# Patient Record
Sex: Male | Born: 1985 | Race: White | Hispanic: No | Marital: Single | Smoking: Current every day smoker
Health system: Southern US, Community
[De-identification: ages and names within clinical notes are randomized; demographics above are authoritative.]

## PROBLEM LIST (undated history)

## (undated) DIAGNOSIS — T7840XA Allergy, unspecified, initial encounter: Secondary | ICD-10-CM

## (undated) DIAGNOSIS — M549 Dorsalgia, unspecified: Secondary | ICD-10-CM

## (undated) HISTORY — DX: Dorsalgia, unspecified: M54.9

## (undated) HISTORY — PX: HERNIA REPAIR: SHX51

## (undated) HISTORY — DX: Allergy, unspecified, initial encounter: T78.40XA

## (undated) HISTORY — PX: FINGER SURGERY: SHX640

---
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
## 2007-01-04 ENCOUNTER — Emergency Department (HOSPITAL_COMMUNITY): Admission: EM | Admit: 2007-01-04 | Discharge: 2007-01-04 | Payer: Self-pay | Admitting: Emergency Medicine

## 2007-02-11 ENCOUNTER — Emergency Department (HOSPITAL_COMMUNITY): Admission: EM | Admit: 2007-02-11 | Discharge: 2007-02-11 | Payer: Self-pay | Admitting: Emergency Medicine

## 2007-02-17 ENCOUNTER — Emergency Department (HOSPITAL_COMMUNITY): Admission: EM | Admit: 2007-02-17 | Discharge: 2007-02-17 | Payer: Self-pay | Admitting: Emergency Medicine

## 2007-06-21 ENCOUNTER — Emergency Department (HOSPITAL_COMMUNITY): Admission: EM | Admit: 2007-06-21 | Discharge: 2007-06-21 | Payer: Self-pay | Admitting: Emergency Medicine

## 2007-06-24 ENCOUNTER — Emergency Department (HOSPITAL_COMMUNITY): Admission: EM | Admit: 2007-06-24 | Discharge: 2007-06-24 | Payer: Self-pay | Admitting: Emergency Medicine

## 2007-08-31 ENCOUNTER — Emergency Department (HOSPITAL_COMMUNITY): Admission: EM | Admit: 2007-08-31 | Discharge: 2007-08-31 | Payer: Self-pay | Admitting: Emergency Medicine

## 2007-09-01 ENCOUNTER — Observation Stay (HOSPITAL_COMMUNITY): Admission: EM | Admit: 2007-09-01 | Discharge: 2007-09-02 | Payer: Self-pay | Admitting: Emergency Medicine

## 2007-10-04 ENCOUNTER — Emergency Department (HOSPITAL_COMMUNITY): Admission: EM | Admit: 2007-10-04 | Discharge: 2007-10-04 | Payer: Self-pay | Admitting: Emergency Medicine

## 2007-11-01 ENCOUNTER — Emergency Department (HOSPITAL_COMMUNITY): Admission: EM | Admit: 2007-11-01 | Discharge: 2007-11-01 | Payer: Self-pay | Admitting: Emergency Medicine

## 2007-11-30 ENCOUNTER — Emergency Department (HOSPITAL_COMMUNITY): Admission: EM | Admit: 2007-11-30 | Discharge: 2007-11-30 | Payer: Self-pay | Admitting: Emergency Medicine

## 2008-01-12 ENCOUNTER — Emergency Department (HOSPITAL_COMMUNITY): Admission: EM | Admit: 2008-01-12 | Discharge: 2008-01-12 | Payer: Self-pay | Admitting: Emergency Medicine

## 2008-04-12 ENCOUNTER — Emergency Department (HOSPITAL_COMMUNITY): Admission: EM | Admit: 2008-04-12 | Discharge: 2008-04-12 | Payer: Self-pay | Admitting: Emergency Medicine

## 2008-06-05 ENCOUNTER — Emergency Department (HOSPITAL_COMMUNITY): Admission: EM | Admit: 2008-06-05 | Discharge: 2008-06-05 | Payer: Self-pay | Admitting: Family Medicine

## 2008-06-09 ENCOUNTER — Ambulatory Visit (HOSPITAL_COMMUNITY): Admission: RE | Admit: 2008-06-09 | Discharge: 2008-06-09 | Payer: Self-pay | Admitting: Family Medicine

## 2008-07-04 ENCOUNTER — Encounter (HOSPITAL_COMMUNITY): Admission: RE | Admit: 2008-07-04 | Discharge: 2008-08-03 | Payer: Self-pay | Admitting: Family Medicine

## 2009-07-12 ENCOUNTER — Emergency Department (HOSPITAL_COMMUNITY): Admission: EM | Admit: 2009-07-12 | Discharge: 2009-07-12 | Payer: Self-pay | Admitting: Emergency Medicine

## 2009-08-06 ENCOUNTER — Emergency Department (HOSPITAL_COMMUNITY): Admission: EM | Admit: 2009-08-06 | Discharge: 2009-08-06 | Payer: Self-pay | Admitting: Emergency Medicine

## 2009-08-30 ENCOUNTER — Emergency Department (HOSPITAL_COMMUNITY): Admission: EM | Admit: 2009-08-30 | Discharge: 2009-08-30 | Payer: Self-pay | Admitting: Emergency Medicine

## 2009-11-18 ENCOUNTER — Emergency Department (HOSPITAL_COMMUNITY): Admission: EM | Admit: 2009-11-18 | Discharge: 2009-11-18 | Payer: Self-pay | Admitting: Emergency Medicine

## 2009-12-18 ENCOUNTER — Emergency Department (HOSPITAL_COMMUNITY): Admission: EM | Admit: 2009-12-18 | Discharge: 2009-12-18 | Payer: Self-pay | Admitting: Emergency Medicine

## 2010-05-07 LAB — DIFFERENTIAL
Lymphocytes Relative: 45 % (ref 12–46)
Monocytes Absolute: 0.4 10*3/uL (ref 0.1–1.0)
Monocytes Relative: 6 % (ref 3–12)
Neutro Abs: 3.1 10*3/uL (ref 1.7–7.7)
Neutrophils Relative %: 44 % (ref 43–77)

## 2010-05-07 LAB — COMPREHENSIVE METABOLIC PANEL
Albumin: 4 g/dL (ref 3.5–5.2)
BUN: 9 mg/dL (ref 6–23)
Calcium: 9.1 mg/dL (ref 8.4–10.5)
Creatinine, Ser: 0.81 mg/dL (ref 0.4–1.5)
Glucose, Bld: 97 mg/dL (ref 70–99)
Total Protein: 6.5 g/dL (ref 6.0–8.3)

## 2010-05-07 LAB — AMYLASE: Amylase: 67 U/L (ref 27–131)

## 2010-05-07 LAB — URINALYSIS, ROUTINE W REFLEX MICROSCOPIC
Glucose, UA: NEGATIVE mg/dL
Ketones, ur: NEGATIVE mg/dL
Leukocytes, UA: NEGATIVE
Protein, ur: NEGATIVE mg/dL

## 2010-05-07 LAB — CBC
HCT: 39 % (ref 39.0–52.0)
MCHC: 34.9 g/dL (ref 30.0–36.0)
MCV: 96.3 fL (ref 78.0–100.0)
Platelets: 180 10*3/uL (ref 150–400)
RDW: 13.5 % (ref 11.5–15.5)

## 2010-06-06 ENCOUNTER — Emergency Department (HOSPITAL_COMMUNITY)
Admission: EM | Admit: 2010-06-06 | Discharge: 2010-06-07 | Disposition: A | Discharge status: Home / Self Care | Payer: Self-pay | Attending: Emergency Medicine | Admitting: Emergency Medicine

## 2010-06-06 DIAGNOSIS — M25579 Pain in unspecified ankle and joints of unspecified foot: Secondary | ICD-10-CM | POA: Insufficient documentation

## 2010-06-06 DIAGNOSIS — Y92410 Unspecified street and highway as the place of occurrence of the external cause: Secondary | ICD-10-CM | POA: Insufficient documentation

## 2010-06-06 DIAGNOSIS — Y998 Other external cause status: Secondary | ICD-10-CM | POA: Insufficient documentation

## 2010-06-06 DIAGNOSIS — IMO0002 Reserved for concepts with insufficient information to code with codable children: Secondary | ICD-10-CM | POA: Insufficient documentation

## 2010-06-07 ENCOUNTER — Emergency Department (HOSPITAL_COMMUNITY): Payer: Self-pay

## 2010-06-11 NOTE — H&P (Signed)
NAME:  Dustin Castillo, Dustin Castillo               ACCOUNT NO.:  [id number]   MEDICAL RECORD NO.:  [id number]          PATIENT TYPE:  INP   LOCATION:  5123                         FACILITY:  [facility]   PHYSICIAN:  Antony Contras, MD     DATE OF BIRTH:  03-09-1985   DATE OF ADMISSION:  09/01/2007  DATE OF DISCHARGE:                              HISTORY & PHYSICAL   CHIEF COMPLAINT:  Throat bleeding.   HISTORY OF PRESENT ILLNESS:  The patient is a 25 year old white male who  underwent tonsillectomy 1 week ago because of breathing troubles.  He  was having significant throat pain earlier today and started bleeding  from his throat at about 10:30 in the morning.  He went to [facility]  Emergency Room and was checked and able to be sent home with no  additional bleeding.  He came by our office afterwards and got a pain  medication prescription.  He was not bleeding at that time.  Earlier  this evening, he became upset, but was able to go to sleep, but woke up  later with bleeding from his throat again.  He presented back to [facility] Emergency Room and was transferred to [facility].  He  continues to bleed from his throat presently.   PAST MEDICAL HISTORY:  Substance abuse.   PAST SURGICAL HISTORY:  Tonsillectomy.   MEDICATIONS:  Percocet.   ALLERGIES:  No known drug allergies.   FAMILY HISTORY:  No bleeding problems.   SOCIAL HISTORY:  The patient is a smoker.   REVIEW OF SYSTEMS:  Negative except as listed above.   PHYSICAL EXAMINATION:  VITAL SIGNS: Temperature 97.8, blood pressure  103/61, pulse 72, and respirations 14.  GENERAL:  The patient is bleeding from his mouth and spitting out blood  in clots.  He is alert and interactive.  EYES:  Extraocular movements are intact.  Pupils are equal, round, and  reactive to light.  EARS:  External ears are normal and ear canals are patent.  NOSE:  Nose is normal except for some blood clot in the right nasal  passage.  ORAL CAVITY,  OROPHARYNX:  There is blood in the mouth with clot and  bleeding from the left tonsil fossa.  The teeth, gums,  tongue, and  floor of mouth are normal.  NECK:  Nontender without mass or deformity.  THYROID:  Normal to palpation.  SALIVARY GLANDS:  Normal to palpation.  CRANIAL NERVES: II-XII are grossly intact.   LABORATORY DATA:  Hemoglobin 12.9.   ASSESSMENT:  The patient is a 25 year old white male with post  tonsillectomy hemorrhage.   PLAN:  The patient will be brought immediately to the operating room for  control of bleeding under anesthesia.  This was discussed with the  patient and his grandfather and risks, benefits, and alternatives were  discussed and the stomach will be suctioned out in the operating room.  After surgery, he will be admitted overnight for hydration and pain  control.      Antony Contras, MD  Electronically Signed     DDB/MEDQ  D:  09/01/2007  T:  09/01/2007  Job:  [id number]

## 2010-06-11 NOTE — Op Note (Signed)
NAME:  Dustin Castillo, Dustin Castillo               ACCOUNT NO.:  [id number]   MEDICAL RECORD NO.:  [id number]          PATIENT TYPE:  INP   LOCATION:  5123                         FACILITY:  [facility]   PHYSICIAN:  Antony Contras, MD     DATE OF BIRTH:  [birth year]   DATE OF PROCEDURE:  09/01/2007  DATE OF DISCHARGE:                               OPERATIVE REPORT   PREOPERATIVE DIAGNOSIS:  Post tonsillectomy hemorrhage.   POSTOPERATIVE DIAGNOSIS:  Post tonsillectomy hemorrhage.   PROCEDURE:  Electrocautery control of post tonsillectomy hemorrhage.   SURGEON:  Antony Contras, MD   ANESTHESIA:  General endotracheal anesthesia.   COMPLICATIONS:  None.   INDICATIONS:  The patient is a 25 year old white male who underwent  tonsillectomy 1 week ago and started bleeding earlier today.  He went to  the emergency department where it stopped, but resumed again this  evening.  He returned to the emergency department and was transferred to  the [facility]  He is actively bleeding and is brought to the  operating room for control of bleeding.   FINDINGS:  There was a clot covering the right tonsil fossa that was  removed exposing an inferior pole bleeding site.  This was controlled  with electrocautery.  The stomach was suctioned and found to have old  blood and clot within it.   DESCRIPTION OF PROCEDURE:  The patient was identified in the holding  room, and informed consent having been obtained including discussion of  risks, benefits, and alternatives, the patient was brought to the  operative suite and put on the operative table in supine position.  Anesthesia was induced and the patient was intubated by anesthesia team  without difficulty.  The eyes were taped closed and the bed was turned  90 degrees from anesthesia.  Head wrap was placed around the patient's  head.  A Crowe-Davis retractor was inserted in the mouth and the opening  revealed the oropharynx.  He was placed in a suspension on the  Mayo  stand.  The oropharynx was suctioned and the clot removed from the right  tonsillar fossa.  Bleeding site was exposed and was then controlled with  suction cautery at a setting of 45.  After this was completed, the  throat was copiously irrigated with saline.  A large nasogastric tube  was passed down the esophagus to suck out the stomach and esophagus.  A  little bit of saline  lavage was included in this.  After this was completed, the retractors  were taken out of suspension and removed from the patient's mouth.  He  was then turned back in to Anesthesia for wake up and was extubated and  moved to the recovery room in stable condition.      Antony Contras, MD  Electronically Signed     DDB/MEDQ  D:  09/01/2007  T:  09/01/2007  Job:  [id number]

## 2010-06-14 NOTE — Op Note (Signed)
NAME:  Dustin, Castillo                       ACCOUNT NO.:  [id number]   MEDICAL RECORD NO.:  [id number]                   PATIENT TYPE:  EMS   LOCATION:  ED                                   FACILITY:  [facility]   PHYSICIAN:  Dionne Ano. Everlene Other, M.D.         DATE OF BIRTH:  06/27/1985   DATE OF PROCEDURE:  DATE OF DISCHARGE:                                 OPERATIVE REPORT   PREOPERATIVE DIAGNOSIS:  Dustin Castillo is a very pleasant male, referred  by Lorin Picket A. Luking, M.D. in regard to his upper extremity injury.  The  patient is a 25 year old, right-hand-dominant male, who injured his right  middle finger earlier today while playing basketball.  He had an open distal  phalanx fracture through the growth plate with nail bed laceration.  He  presented to Dr. Fletcher Anon office.  X-rays revealed the open fracture, and he  was transferred for further definitive care.   PAST MEDICAL HISTORY:  None.   PAST SURGICAL HISTORY:  Umbilical hernia repair.   MEDICINES:  None.   ALLERGIES:  None.   SOCIAL HISTORY:  He is a healthy 25 year old male.   PHYSICAL EXAMINATION:  RIGHT MIDDLE FINGER:  The patient has a right middle  finger open distal phalanx fracture through the growth plate with nail bed  avulsion and a hematoma.  The nail bed is partially attached.  The remaining  fingers are intact.  WRIST/ELBOW/SHOULDER:  Benign.  BILATERAL LOWER EXTREMITIES/OPPOSITE EXTREMITY:  Benign, neurovascularly  intact, normal alignment, __________ range of motion.  VITAL SIGNS:  He is afebrile.  Vital signs are stable.  GENERAL:  The patient is awake, alert, and oriented and accompanied to the  emergency room with his grandmother.   RADIOLOGY:  X-rays are reviewed which show a fracture to the growth plate.  This is displaced and angulated.   IMPRESSION:  Open distal phalanx fracture, right middle finger.   PLAN:  I have discussed with the patient his findings.  I have verbally  consented him  for I&D and repair of structures as necessary.   PROCEDURE NOTE:  The patient was given a lidocaine/Marcaine block in the  metacarpal and tendon sheath in nature.  He tolerated this well.  He was  then prepped and draped in the usual sterile fashion with Betadine scrub and  paint.  Following this, he was laid supine, had a sterile field secured, and  underwent I&D of skin and subcutaneous tissue, bone and nail bed tissue, and  tendinous tissue.  I performed a nail plate removal without difficulty.  He  tolerated this well.  Once the plate removal was accomplished, we irrigated  the area with greater than two liters of saline, curettaged to bony ends,  being careful to not disrupt the growth plate.  I did this very gently and  lightly so as to not encroach upon the patient's growth plate.  Once this  was done, the patient  underwent open reduction.  This was done to my  satisfaction without difficulty.  Following this, the patient had repair of  the nail bed.  This was done with 4-0 and 6-0 chromic suture.  This was done  under 4.0 loupe magnification.  I did make step cuts in the eponychial fold  during this and did have to disimpact the eponychium from the fracture site,  as it was imbedded during the initial I&D.  Once nail bed repair was  accomplished, the lateral folds were sutured with 4-0 chromic.  The patient  was then held in a reduced position and had sterile dressing application  after tourniquet was deflated and hemostasis was obtained.  Adaptic was  placed in the eponychial fold to prevent nail bed adherence, and the patient  tolerated this well.  Following this, finger splint was placed to keep the  DIP in a hyperextended position.  He had excellent refill and was placed in  a volar short arm splint.  He had postreduction x-rays taken which I  reviewed.  He tolerated the procedure well, and there were no complications.  He will be discharged home on Keflex, Vicodin, and Robaxin  and will return  to see [place] in seven days for x-ray, and we will keep him immobilized for  three weeks in the splint.  All questions have been encouraged and answered.  He tolerated the procedure well.                                               Dionne Ano. Everlene Other, M.D.    Nash Mantis  D:  05/05/2002  T:  05/06/2002  Job:  [id number]   cc:   Lorin Picket A. Gerda Diss, M.D.  [address], [address]  [place]  [place] [address]  Fax: [phone]

## 2010-10-23 LAB — DIFFERENTIAL
Basophils Absolute: 0
Basophils Relative: 0
Eosinophils Relative: 2
Lymphocytes Relative: 33
Monocytes Absolute: 0.8

## 2010-10-23 LAB — RAPID STREP SCREEN (MED CTR MEBANE ONLY): Streptococcus, Group A Screen (Direct): NEGATIVE

## 2010-10-23 LAB — BASIC METABOLIC PANEL
BUN: 8
CO2: 28
Calcium: 9
GFR calc non Af Amer: >60
Glucose, Bld: 80
Potassium: 3.3 — ABNORMAL LOW

## 2010-10-23 LAB — STREP A DNA PROBE: Group A Strep Probe: NEGATIVE

## 2010-10-23 LAB — CBC
HCT: 39
Hemoglobin: 14.2
MCHC: 36.5 — ABNORMAL HIGH
Platelets: 199
RDW: 12.9

## 2010-10-23 LAB — RAPID URINE DRUG SCREEN, HOSP PERFORMED
Amphetamines: NOT DETECTED
Barbiturates: POSITIVE — AB
Cocaine: NOT DETECTED
Opiates: NOT DETECTED
Tetrahydrocannabinol: NOT DETECTED

## 2010-10-25 LAB — CBC
HCT: 36.9 — ABNORMAL LOW
HCT: 41.8
Hemoglobin: 12.9 — ABNORMAL LOW
Hemoglobin: 14.1
MCHC: 33.6
MCHC: 34.9
MCV: 97.3
MCV: 99
Platelets: 262
RBC: 3.8 — ABNORMAL LOW
RBC: 4.23
RDW: 11.9
RDW: 12.2
WBC: 8.1

## 2010-10-25 LAB — DIFFERENTIAL
Basophils Absolute: 0
Basophils Absolute: 0.1
Basophils Relative: 0
Basophils Relative: 1
Eosinophils Absolute: 0.2
Eosinophils Absolute: 0.2
Eosinophils Relative: 2
Eosinophils Relative: 3
Lymphocytes Relative: 50 — ABNORMAL HIGH
Lymphs Abs: 4.1 — ABNORMAL HIGH
Monocytes Absolute: 0.6
Monocytes Absolute: 0.7
Monocytes Relative: 8
Monocytes Relative: 9
Neutro Abs: 3.2
Neutrophils Relative %: 39 — ABNORMAL LOW

## 2010-10-30 LAB — STREP A DNA PROBE

## 2010-10-30 LAB — RAPID STREP SCREEN (MED CTR MEBANE ONLY): Streptococcus, Group A Screen (Direct): NEGATIVE

## 2011-03-18 ENCOUNTER — Emergency Department (HOSPITAL_COMMUNITY): Payer: Self-pay

## 2011-03-18 ENCOUNTER — Emergency Department (HOSPITAL_COMMUNITY)
Admission: EM | Admit: 2011-03-18 | Discharge: 2011-03-18 | Disposition: A | Discharge status: Home / Self Care | Payer: Self-pay | Attending: Emergency Medicine | Admitting: Emergency Medicine

## 2011-03-18 ENCOUNTER — Encounter (HOSPITAL_COMMUNITY): Payer: Self-pay

## 2011-03-18 DIAGNOSIS — R109 Unspecified abdominal pain: Secondary | ICD-10-CM | POA: Insufficient documentation

## 2011-03-18 DIAGNOSIS — K625 Hemorrhage of anus and rectum: Secondary | ICD-10-CM | POA: Insufficient documentation

## 2011-03-18 DIAGNOSIS — F17200 Nicotine dependence, unspecified, uncomplicated: Secondary | ICD-10-CM | POA: Insufficient documentation

## 2011-03-18 LAB — DIFFERENTIAL
Basophils Relative: 0 % (ref 0–1)
Eosinophils Absolute: 0.3 10*3/uL (ref 0.0–0.7)
Eosinophils Relative: 4 % (ref 0–5)
Lymphs Abs: 2.8 10*3/uL (ref 0.7–4.0)
Monocytes Relative: 8 % (ref 3–12)

## 2011-03-18 LAB — COMPREHENSIVE METABOLIC PANEL
BUN: 10 mg/dL (ref 6–23)
Calcium: 9.7 mg/dL (ref 8.4–10.5)
GFR calc Af Amer: >90 mL/min (ref >90)
Glucose, Bld: 94 mg/dL (ref 70–99)
Total Protein: 7.4 g/dL (ref 6.0–8.3)

## 2011-03-18 LAB — CBC
Hemoglobin: 15.6 g/dL (ref 13.0–17.0)
MCH: 33.7 pg (ref 26.0–34.0)
MCHC: 34.8 g/dL (ref 30.0–36.0)
MCV: 96.8 fL (ref 78.0–100.0)
Platelets: 265 10*3/uL (ref 150–400)
RBC: 4.63 MIL/uL (ref 4.22–5.81)

## 2011-03-18 LAB — URINALYSIS, ROUTINE W REFLEX MICROSCOPIC
Leukocytes, UA: NEGATIVE
Nitrite: NEGATIVE
Specific Gravity, Urine: 1.005 (ref 1.005–1.030)
pH: 5.5 (ref 5.0–8.0)

## 2011-03-18 MED ORDER — ONDANSETRON HCL 4 MG/2ML IJ SOLN
4.0000 mg | Freq: Once | INTRAMUSCULAR | Status: AC
  Administered 2011-03-18: 4 mg via INTRAVENOUS
  Filled 2011-03-18: qty 2

## 2011-03-18 MED ORDER — IOHEXOL 300 MG/ML  SOLN
100.0000 mL | Freq: Once | INTRAMUSCULAR | Status: AC | PRN
  Administered 2011-03-18: 100 mL via INTRAVENOUS

## 2011-03-18 MED ORDER — HYDROCODONE-ACETAMINOPHEN 5-325 MG PO TABS: ORAL_TABLET | ORAL | Status: AC

## 2011-03-18 MED ORDER — MORPHINE SULFATE 4 MG/ML IJ SOLN
4.0000 mg | Freq: Once | INTRAMUSCULAR | Status: AC
  Administered 2011-03-18: 4 mg via INTRAVENOUS
  Filled 2011-03-18: qty 1

## 2011-03-18 MED ORDER — SODIUM CHLORIDE 0.9 % IV BOLUS (SEPSIS)
500.0000 mL | Freq: Once | INTRAVENOUS | Status: AC
  Administered 2011-03-18: 1000 mL via INTRAVENOUS

## 2011-03-18 MED ORDER — METOCLOPRAMIDE HCL 5 MG/ML IJ SOLN
10.0000 mg | Freq: Once | INTRAMUSCULAR | Status: AC
  Administered 2011-03-18: 10 mg via INTRAVENOUS
  Filled 2011-03-18: qty 2

## 2011-03-18 NOTE — ED Notes (Signed)
Pt reports having left abd pain/flank pain that began started. Pt also states, " my stomach feels uneasy. I went to have BM today and i noticed there was blood in toilet. The blood was bright red blood. There was a lot." pt ambulated to room without difficulty.

## 2011-03-18 NOTE — ED Notes (Signed)
Occult blood pos. Performed by t. tripplett

## 2011-03-18 NOTE — ED Notes (Signed)
Pt resting in bed, resp even 

## 2011-03-18 NOTE — ED Provider Notes (Signed)
History     CSN: [id number]  Arrival date & time 03/18/11  4098   First MD Initiated Contact with Patient 03/18/11 1001      Chief Complaint  Patient presents with  . Flank Pain  . Rectal Bleeding  . Nausea    (Consider location/radiation/quality/duration/timing/severity/associated sxs/prior treatment) HPI Comments: Patient complains of intermittent left flank pain for several days.  He states the pain has been mild.  This morning he states he woke up feeling nauseated the pain in his left flank became worse and slightly radiates to his abdomen.  Patient states that when he had a bowel movement this morning he noticed a large amount of bright red blood in the toilet and on the tissue after wiping.  He also reports loose stool this morning.  States that his stools occasionally "look like there is mucus in it".  He denies fever, dysuria, hematuria, or vomiting. He states he contacted his primary care physician this morning and was advised to come to the ER for further evaluation.  Patient is a 26 y.o. male presenting with flank pain and hematochezia. The history is provided by the patient. No language interpreter was used.  Flank Pain This is a new problem. The current episode started in the past 7 days. The problem occurs intermittently. The problem has been gradually worsening. Associated symptoms include abdominal pain and nausea. Pertinent negatives include no change in bowel habit, chest pain, chills, coughing, fever, headaches, neck pain, numbness, rash, sore throat, urinary symptoms, vomiting or weakness. Exacerbated by: defecation. He has tried nothing for the symptoms. The treatment provided no relief.  Rectal Bleeding  The current episode started today. The onset was sudden. The problem has been resolved. The patient is experiencing no pain. The stool is described as soft. There was no prior successful therapy. There was no prior unsuccessful therapy. Associated symptoms include  abdominal pain, diarrhea and nausea. Pertinent negatives include no fever, no hematemesis, no hemorrhoids, no vomiting, no hematuria, no chest pain, no headaches, no coughing and no rash. He has been behaving normally.    History reviewed. No pertinent past medical history.  Past Surgical History  Procedure Date  . Hernia repair   . Hand surgery     History reviewed. No pertinent family history.  History  Substance Use Topics  . Smoking status: Current Everyday Smoker  . Smokeless tobacco: Not on file  . Alcohol Use: No      Review of Systems  Constitutional: Negative for fever, chills, activity change and appetite change.  HENT: Negative for sore throat and neck pain.   Respiratory: Negative for cough.   Cardiovascular: Negative for chest pain.  Gastrointestinal: Positive for nausea, abdominal pain, diarrhea, blood in stool and hematochezia. Negative for vomiting, constipation, change in bowel habit, hematemesis and hemorrhoids.  Genitourinary: Positive for flank pain. Negative for dysuria, frequency, hematuria, decreased urine volume, scrotal swelling, difficulty urinating and testicular pain.  Musculoskeletal: Negative.   Skin: Negative for rash.  Neurological: Negative for dizziness, weakness, numbness and headaches.  Hematological: Negative for adenopathy. Does not bruise/bleed easily.  All other systems reviewed and are negative.    Allergies  Penicillins  Home Medications  No current outpatient prescriptions on file.  BP 121/73  Pulse 92  Temp 98.1 F (36.7 C)  Resp 16  Ht 5\' 9"  (1.753 m)  Wt 200 lb (90.719 kg)  BMI 29.53 kg/m2  SpO2 100%  Physical Exam  Nursing note and vitals reviewed. Constitutional: He is  oriented to person, place, and time. He appears well-developed and well-nourished. No distress.  HENT:  Head: Normocephalic and atraumatic.  Mouth/Throat: Oropharynx is clear and moist.  Neck: Normal range of motion. Neck supple.    Cardiovascular: Normal rate, regular rhythm and normal heart sounds.   No murmur heard. Pulmonary/Chest: Effort normal and breath sounds normal. No respiratory distress.  Abdominal: Soft. Bowel sounds are normal. He exhibits no distension and no mass. There is no hepatosplenomegaly. There is tenderness in the left lower quadrant. There is no rebound, no guarding and no CVA tenderness.  Genitourinary: Rectal exam shows no external hemorrhoid, no internal hemorrhoid, no fissure, no mass, no tenderness and anal tone normal. Guaiac positive stool.  Musculoskeletal: Normal range of motion. He exhibits no edema and no tenderness.  Lymphadenopathy:    He has no cervical adenopathy.  Neurological: He is alert and oriented to person, place, and time. He exhibits normal muscle tone. Coordination normal.  Skin: Skin is warm and dry.    ED Course  Procedures (including critical care time)  Results for orders placed during the hospital encounter of 03/18/11  CBC      Component Value Range   WBC 6.6  4.0 - 10.5 (K/uL)   RBC 4.63  4.22 - 5.81 (MIL/uL)   Hemoglobin 15.6  13.0 - 17.0 (g/dL)   HCT 25.3  66.4 - 40.3 (%)   MCV 96.8  78.0 - 100.0 (fL)   MCH 33.7  26.0 - 34.0 (pg)   MCHC 34.8  30.0 - 36.0 (g/dL)   RDW 47.4  25.9 - 56.3 (%)   Platelets 265  150 - 400 (K/uL)  DIFFERENTIAL      Component Value Range   Neutrophils Relative 47  43 - 77 (%)   Neutro Abs 3.1  1.7 - 7.7 (K/uL)   Lymphocytes Relative 42  12 - 46 (%)   Lymphs Abs 2.8  0.7 - 4.0 (K/uL)   Monocytes Relative 8  3 - 12 (%)   Monocytes Absolute 0.5  0.1 - 1.0 (K/uL)   Eosinophils Relative 4  0 - 5 (%)   Eosinophils Absolute 0.3  0.0 - 0.7 (K/uL)   Basophils Relative 0  0 - 1 (%)   Basophils Absolute 0.0  0.0 - 0.1 (K/uL)  COMPREHENSIVE METABOLIC PANEL      Component Value Range   Sodium 139  135 - 145 (mEq/L)   Potassium 4.3  3.5 - 5.1 (mEq/L)   Chloride 104  96 - 112 (mEq/L)   CO2 29  19 - 32 (mEq/L)   Glucose, Bld 94  70  - 99 (mg/dL)   BUN 10  6 - 23 (mg/dL)   Creatinine, Ser 8.75  0.50 - 1.35 (mg/dL)   Calcium 9.7  8.4 - 64.3 (mg/dL)   Total Protein 7.4  6.0 - 8.3 (g/dL)   Albumin 4.3  3.5 - 5.2 (g/dL)   AST 18  0 - 37 (U/L)   ALT 15  0 - 53 (U/L)   Alkaline Phosphatase 60  39 - 117 (U/L)   Total Bilirubin 0.4  0.3 - 1.2 (mg/dL)   GFR calc non Af Amer >90  >90 (mL/min)   GFR calc Af Amer >90  >90 (mL/min)  URINALYSIS, ROUTINE W REFLEX MICROSCOPIC      Component Value Range   Color, Urine YELLOW  YELLOW    APPearance CLEAR  CLEAR    Specific Gravity, Urine 1.005  1.005 - 1.030  pH 5.5  5.0 - 8.0    Glucose, UA NEGATIVE  NEGATIVE (mg/dL)   Hgb urine dipstick NEGATIVE  NEGATIVE    Bilirubin Urine NEGATIVE  NEGATIVE    Ketones, ur NEGATIVE  NEGATIVE (mg/dL)   Protein, ur NEGATIVE  NEGATIVE (mg/dL)   Urobilinogen, UA 0.2  0.0 - 1.0 (mg/dL)   Nitrite NEGATIVE  NEGATIVE    Leukocytes, UA NEGATIVE  NEGATIVE     Ct Abdomen Pelvis W Contrast  03/18/2011  *RADIOLOGY REPORT*  Clinical Data: Left flank pain, rectal bleeding, nausea  CT ABDOMEN AND PELVIS WITH CONTRAST  Technique:  Multidetector CT imaging of the abdomen and pelvis was performed following the standard protocol during bolus administration of intravenous contrast.  Contrast: OMNIPAQUE IOHEXOL 300 MG/ML IV SOLN  Comparison: None.  Findings: The lung bases are clear.  The liver enhances with no focal abnormality and no ductal dilatation is seen.  No calcified gallstones are noted.  The pancreas is normal in size and the pancreatic duct is not dilated.  The adrenal glands and spleen are unremarkable.  The stomach is moderately distended with contrast media and is unremarkable.  The kidneys enhance with no calculus or mass, and no hydronephrosis is seen.  The abdominal aorta is normal in caliber.  The appendix is well visualized in the right abdomen and fills with air normally.  The terminal ileum is unremarkable.  The urinary bladder is urine  distended with no abnormality noted.  The distal ureters are normal in caliber and no distal ureteral calculus is seen.  No fluid is noted within the pelvis.  No abnormality of the colon is seen.  No bony abnormality is seen.  IMPRESSION:  1.  No explanation for the patient's pain is seen.  No calculi are noted and there is no evidence of hydronephrosis. 2.  The appendix and terminal ileum are unremarkable.  Original Report Authenticated By: Juline Patch, M.D.       MDM    Patient ambulated to the restroom without difficulty. He states he is feeling better since IVF's, and medication   Frank blood on rectal exam without obvious masses or tenderness.  LLQ tenderness on exam is mild without guarding, rebound tenderness or peritoneal signs.   2:35 PM consult to Dr. Jena Gauss.  He will contact patient to arrange followup appointment for tomorrow.  I have discussed importance of f/u with the patient and he agrees to care plan.   Pt feels improved after observation and/or treatment in ED. Patient / Family / Caregiver understand and agree with initial ED impression and plan with expectations set for ED visit. Pt stable in ED with no significant deterioration in condition.   Charlee Squibb L. Yaneisy Wenz, [place] 03/20/11 2030

## 2011-03-18 NOTE — Discharge Instructions (Signed)

## 2011-03-20 NOTE — ED Provider Notes (Signed)
Medical screening examination/treatment/procedure(s) were performed by non-physician practitioner and as supervising physician I was immediately available for consultation/collaboration.    Dusten Ellinwood L Mustafa Potts, MD 03/20/11 2304 

## 2011-03-24 ENCOUNTER — Encounter: Payer: Self-pay | Admitting: Gastroenterology

## 2011-03-24 ENCOUNTER — Ambulatory Visit (INDEPENDENT_AMBULATORY_CARE_PROVIDER_SITE_OTHER): Payer: Self-pay | Admitting: Gastroenterology

## 2011-03-24 VITALS — BP 126/66 | HR 85 | Temp 98.2°F | Ht 69.0 in | Wt 190.4 lb

## 2011-03-24 DIAGNOSIS — K625 Hemorrhage of anus and rectum: Secondary | ICD-10-CM

## 2011-03-24 DIAGNOSIS — R197 Diarrhea, unspecified: Secondary | ICD-10-CM
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
## 2011-03-24 NOTE — Patient Instructions (Signed)
Please contact Lubertha Basque so you may be able to proceed with your procedure as soon as possible.  We have given you containers for the stool studies. Please collect these as well.   Contact our office if you have any further issues or concerns. Review the low-fiber diet and low-fat diet for now. This will help calm down the digestive process for a bit.   We will ultimately need to proceed with a colonoscopy. Once you have approval from [facility], please call us so we may schedule this.

## 2011-03-25 DIAGNOSIS — K625 Hemorrhage of anus and rectum: Secondary | ICD-10-CM | POA: Insufficient documentation

## 2011-03-25 DIAGNOSIS — R197 Diarrhea, unspecified: Secondary | ICD-10-CM | POA: Insufficient documentation

## 2011-03-25 NOTE — Assessment & Plan Note (Signed)
See diarrhea. One episode, none since. Pt instructed to call if any recurrence or worsening of symptoms in interim.
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
## 2011-03-26 NOTE — Progress Notes (Signed)
Faxed to PCP

## 2011-05-27 ENCOUNTER — Encounter: Payer: Self-pay | Admitting: Gastroenterology

## 2011-05-27 NOTE — Progress Notes (Unsigned)
Appears was waiting on [employer] assistance before setting up TCS. Let's send letter to offer f/u if still having problems. We were unable to do much at time of visit and had left things in his court to pursue assistance (gave #,s etc).

## 2011-06-19 ENCOUNTER — Encounter: Payer: Self-pay | Admitting: Internal Medicine

## 2011-06-19 NOTE — Progress Notes (Signed)
Mailed letter for patient to call our office to set up OV if needed

## 2011-08-20 ENCOUNTER — Other Ambulatory Visit: Payer: Self-pay | Admitting: Nurse Practitioner

## 2011-08-20 ENCOUNTER — Ambulatory Visit (HOSPITAL_COMMUNITY)
Admission: RE | Admit: 2011-08-20 | Discharge: 2011-08-20 | Disposition: A | Discharge status: Home / Self Care | Payer: Self-pay | Source: Ambulatory Visit | Attending: Nurse Practitioner | Admitting: Nurse Practitioner

## 2011-08-20 DIAGNOSIS — W228XXA Striking against or struck by other objects, initial encounter: Secondary | ICD-10-CM

## 2011-08-20 DIAGNOSIS — M25519 Pain in unspecified shoulder: Secondary | ICD-10-CM | POA: Insufficient documentation

## 2011-08-20 DIAGNOSIS — IMO0002 Reserved for concepts with insufficient information to code with codable children: Secondary | ICD-10-CM | POA: Insufficient documentation

## 2011-09-10 ENCOUNTER — Emergency Department (HOSPITAL_COMMUNITY)
Admission: EM | Admit: 2011-09-10 | Discharge: 2011-09-10 | Disposition: A | Discharge status: Home / Self Care | Payer: Self-pay | Attending: Emergency Medicine | Admitting: Emergency Medicine

## 2011-09-10 ENCOUNTER — Encounter (HOSPITAL_COMMUNITY): Payer: Self-pay | Admitting: *Deleted

## 2011-09-10 DIAGNOSIS — IMO0002 Reserved for concepts with insufficient information to code with codable children: Secondary | ICD-10-CM

## 2011-09-10 DIAGNOSIS — S61209A Unspecified open wound of unspecified finger without damage to nail, initial encounter: Secondary | ICD-10-CM | POA: Insufficient documentation

## 2011-09-10 DIAGNOSIS — W230XXA Caught, crushed, jammed, or pinched between moving objects, initial encounter: Secondary | ICD-10-CM | POA: Insufficient documentation

## 2011-09-10 MED ORDER — BACITRACIN ZINC 500 UNIT/GM EX OINT
TOPICAL_OINTMENT | CUTANEOUS | Status: AC
  Administered 2011-09-10: 02:00:00
  Filled 2011-09-10: qty 0.9

## 2011-09-10 MED ORDER — IBUPROFEN 800 MG PO TABS: 800.0000 mg | ORAL_TABLET | Freq: Once | ORAL | Status: DC
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
## 2011-12-04 ENCOUNTER — Emergency Department (HOSPITAL_COMMUNITY): Payer: Self-pay

## 2011-12-04 ENCOUNTER — Emergency Department (HOSPITAL_COMMUNITY)
Admission: EM | Admit: 2011-12-04 | Discharge: 2011-12-04 | Disposition: A | Discharge status: Home / Self Care | Payer: Self-pay | Attending: Emergency Medicine | Admitting: Emergency Medicine

## 2011-12-04 ENCOUNTER — Encounter (HOSPITAL_COMMUNITY): Payer: Self-pay | Admitting: Emergency Medicine

## 2011-12-04 DIAGNOSIS — R1011 Right upper quadrant pain: Secondary | ICD-10-CM | POA: Insufficient documentation

## 2011-12-04 DIAGNOSIS — R109 Unspecified abdominal pain: Secondary | ICD-10-CM

## 2011-12-04 DIAGNOSIS — F17200 Nicotine dependence, unspecified, uncomplicated: Secondary | ICD-10-CM | POA: Insufficient documentation

## 2011-12-04 DIAGNOSIS — R112 Nausea with vomiting, unspecified: Secondary | ICD-10-CM | POA: Insufficient documentation

## 2011-12-04 DIAGNOSIS — Z79899 Other long term (current) drug therapy: Secondary | ICD-10-CM | POA: Insufficient documentation

## 2011-12-04 LAB — COMPREHENSIVE METABOLIC PANEL
ALT: 29 U/L (ref 0–53)
AST: 24 U/L (ref 0–37)
Albumin: 4.1 g/dL (ref 3.5–5.2)
Alkaline Phosphatase: 68 U/L (ref 39–117)
CO2: 27 mEq/L (ref 19–32)
Chloride: 98 mEq/L (ref 96–112)
GFR calc non Af Amer: >90 mL/min (ref >90)
Potassium: 3.6 mEq/L (ref 3.5–5.1)
Sodium: 136 mEq/L (ref 135–145)
Total Bilirubin: 0.5 mg/dL (ref 0.3–1.2)

## 2011-12-04 LAB — URINALYSIS, ROUTINE W REFLEX MICROSCOPIC
Bilirubin Urine: NEGATIVE
Glucose, UA: NEGATIVE mg/dL
Hgb urine dipstick: NEGATIVE
Ketones, ur: NEGATIVE mg/dL
Leukocytes, UA: NEGATIVE
Protein, ur: NEGATIVE mg/dL
pH: 6.5 (ref 5.0–8.0)

## 2011-12-04 LAB — CBC WITH DIFFERENTIAL/PLATELET
Basophils Absolute: 0 10*3/uL (ref 0.0–0.1)
Basophils Relative: 0 % (ref 0–1)
HCT: 42.7 % (ref 39.0–52.0)
Lymphocytes Relative: 27 % (ref 12–46)
MCHC: 35.6 g/dL (ref 30.0–36.0)
Monocytes Absolute: 0.6 10*3/uL (ref 0.1–1.0)
Neutro Abs: 4.6 10*3/uL (ref 1.7–7.7)
Neutrophils Relative %: 62 % (ref 43–77)
RDW: 12.6 % (ref 11.5–15.5)
WBC: 7.4 10*3/uL (ref 4.0–10.5)

## 2011-12-04 MED ORDER — ONDANSETRON HCL 4 MG/2ML IJ SOLN
4.0000 mg | Freq: Once | INTRAMUSCULAR | Status: AC
  Administered 2011-12-04: 4 mg via INTRAVENOUS
  Filled 2011-12-04: qty 2

## 2011-12-04 MED ORDER — POLYETHYLENE GLYCOL 3350 17 G PO PACK: 17.0000 g | PACK | Freq: Every day | ORAL | Status: DC

## 2011-12-04 MED ORDER — ONDANSETRON HCL 4 MG PO TABS: 4.0000 mg | ORAL_TABLET | Freq: Four times a day (QID) | ORAL | Status: DC

## 2011-12-04 MED ORDER — IOHEXOL 300 MG/ML  SOLN
100.0000 mL | Freq: Once | INTRAMUSCULAR | Status: AC | PRN
  Administered 2011-12-04: 100 mL via INTRAVENOUS

## 2011-12-04 MED ORDER — SODIUM CHLORIDE 0.9 % IV BOLUS (SEPSIS)
1000.0000 mL | Freq: Once | INTRAVENOUS | Status: AC
  Administered 2011-12-04: 1000 mL via INTRAVENOUS

## 2011-12-04 MED ORDER — MORPHINE SULFATE 4 MG/ML IJ SOLN
4.0000 mg | Freq: Once | INTRAMUSCULAR | Status: AC
  Administered 2011-12-04: 4 mg via INTRAVENOUS
  Filled 2011-12-04: qty 1

## 2011-12-04 NOTE — ED Notes (Signed)
Pt c/o abd pain with n/v since last night.  
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
## 2012-07-09 ENCOUNTER — Encounter (HOSPITAL_COMMUNITY): Payer: Self-pay | Admitting: Emergency Medicine

## 2012-07-09 ENCOUNTER — Emergency Department (HOSPITAL_COMMUNITY)
Admission: EM | Admit: 2012-07-09 | Discharge: 2012-07-09 | Disposition: A | Discharge status: Home / Self Care | Payer: Self-pay | Attending: Emergency Medicine | Admitting: Emergency Medicine

## 2012-07-09 DIAGNOSIS — F1210 Cannabis abuse, uncomplicated: Secondary | ICD-10-CM | POA: Insufficient documentation

## 2012-07-09 DIAGNOSIS — Z880 Allergy status to penicillin: Secondary | ICD-10-CM | POA: Insufficient documentation

## 2012-07-09 DIAGNOSIS — F17200 Nicotine dependence, unspecified, uncomplicated: Secondary | ICD-10-CM | POA: Insufficient documentation

## 2012-07-09 DIAGNOSIS — F1410 Cocaine abuse, uncomplicated: Secondary | ICD-10-CM | POA: Insufficient documentation

## 2012-07-09 DIAGNOSIS — F1110 Opioid abuse, uncomplicated: Secondary | ICD-10-CM | POA: Insufficient documentation

## 2012-07-09 DIAGNOSIS — Z8739 Personal history of other diseases of the musculoskeletal system and connective tissue: Secondary | ICD-10-CM | POA: Insufficient documentation

## 2012-07-09 LAB — COMPREHENSIVE METABOLIC PANEL
ALT: 11 U/L (ref 0–53)
AST: 15 U/L (ref 0–37)
Albumin: 4.1 g/dL (ref 3.5–5.2)
CO2: 30 mEq/L (ref 19–32)
Calcium: 9.1 mg/dL (ref 8.4–10.5)
Sodium: 137 mEq/L (ref 135–145)
Total Protein: 7.7 g/dL (ref 6.0–8.3)

## 2012-07-09 LAB — RAPID URINE DRUG SCREEN, HOSP PERFORMED
Amphetamines: NOT DETECTED
Cocaine: POSITIVE — AB
Opiates: POSITIVE — AB
Tetrahydrocannabinol: POSITIVE — AB

## 2012-07-09 LAB — URINALYSIS, ROUTINE W REFLEX MICROSCOPIC
Glucose, UA: NEGATIVE mg/dL
Hgb urine dipstick: NEGATIVE
Leukocytes, UA: NEGATIVE
Specific Gravity, Urine: 1.018 (ref 1.005–1.030)
pH: 5.5 (ref 5.0–8.0)

## 2012-07-09 LAB — CBC WITH DIFFERENTIAL/PLATELET
Basophils Absolute: 0 10*3/uL (ref 0.0–0.1)
Basophils Relative: 0 % (ref 0–1)
Eosinophils Absolute: 0.2 10*3/uL (ref 0.0–0.7)
Eosinophils Relative: 2 % (ref 0–5)
Lymphocytes Relative: 20 % (ref 12–46)
MCHC: 34.7 g/dL (ref 30.0–36.0)
MCV: 94.1 fL (ref 78.0–100.0)
Platelets: 239 10*3/uL (ref 150–400)
RDW: 11.8 % (ref 11.5–15.5)
WBC: 9.4 10*3/uL (ref 4.0–10.5)

## 2012-07-09 MED ORDER — NICOTINE 21 MG/24HR TD PT24
21.0000 mg | MEDICATED_PATCH | Freq: Every day | TRANSDERMAL | Status: DC
  Administered 2012-07-09: 21 mg via TRANSDERMAL
  Filled 2012-07-09: qty 1

## 2012-07-09 MED ORDER — ONDANSETRON HCL 4 MG PO TABS: 4.0000 mg | ORAL_TABLET | Freq: Three times a day (TID) | ORAL | Status: DC | PRN

## 2012-07-09 MED ORDER — ZOLPIDEM TARTRATE 5 MG PO TABS: 5.0000 mg | ORAL_TABLET | Freq: Every evening | ORAL | Status: DC | PRN

## 2012-07-09 MED ORDER — ALUM & MAG HYDROXIDE-SIMETH 200-200-20 MG/5ML PO SUSP: 30.0000 mL | ORAL | Status: DC | PRN

## 2012-07-09 MED ORDER — LORAZEPAM 1 MG PO TABS: 1.0000 mg | ORAL_TABLET | Freq: Three times a day (TID) | ORAL | Status: DC | PRN

## 2012-07-09 MED ORDER — ACETAMINOPHEN 325 MG PO TABS: 650.0000 mg | ORAL_TABLET | ORAL | Status: DC | PRN

## 2012-07-09 MED ORDER — POLYETHYLENE GLYCOL 3350 17 G PO PACK
17.0000 g | PACK | Freq: Every day | ORAL | Status: DC
  Filled 2012-07-09: qty 1

## 2012-07-09 MED ORDER — IBUPROFEN 200 MG PO TABS: 600.0000 mg | ORAL_TABLET | Freq: Three times a day (TID) | ORAL | Status: DC | PRN

## 2012-07-09 NOTE — ED Notes (Signed)
Pt having second thought about staying-explained process to him during/after triage-family encouraging him to stay

## 2012-07-09 NOTE — ED Notes (Signed)
Placed in paper scrubs, wanded by security, belongings behind nurses station

## 2012-07-09 NOTE — ED Notes (Signed)
Per pt, has been using heroin on and off for years-was sober for 5 months while in [place], got around wrong crowd

## 2012-07-09 NOTE — Progress Notes (Signed)
[facility] has seen patient and provided him with a oc application and information on [employer] of the [place].

## 2012-07-09 NOTE — ED Notes (Signed)
Per pt, last use was today
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
## 2012-07-10 NOTE — ED Provider Notes (Signed)
Medical screening examination/treatment/procedure(s) were performed by non-physician practitioner and as supervising physician I was immediately available for consultation/collaboration.    Vida Roller, MD 07/10/12 [phone]

## 2012-09-30 ENCOUNTER — Emergency Department (HOSPITAL_COMMUNITY)
Admission: EM | Admit: 2012-09-30 | Discharge: 2012-10-01 | Disposition: A | Discharge status: Home / Self Care | Payer: Self-pay | Attending: Emergency Medicine | Admitting: Emergency Medicine

## 2012-09-30 ENCOUNTER — Encounter (HOSPITAL_COMMUNITY): Payer: Self-pay | Admitting: Emergency Medicine

## 2012-09-30 DIAGNOSIS — Z87828 Personal history of other (healed) physical injury and trauma: Secondary | ICD-10-CM | POA: Insufficient documentation

## 2012-09-30 DIAGNOSIS — F17200 Nicotine dependence, unspecified, uncomplicated: Secondary | ICD-10-CM | POA: Insufficient documentation

## 2012-09-30 DIAGNOSIS — Z880 Allergy status to penicillin: Secondary | ICD-10-CM | POA: Insufficient documentation

## 2012-09-30 DIAGNOSIS — F1110 Opioid abuse, uncomplicated: Secondary | ICD-10-CM | POA: Insufficient documentation

## 2012-09-30 LAB — RAPID URINE DRUG SCREEN, HOSP PERFORMED
Amphetamines: NOT DETECTED
Barbiturates: NOT DETECTED
Benzodiazepines: NOT DETECTED
Cocaine: POSITIVE — AB
Opiates: POSITIVE — AB
Tetrahydrocannabinol: NOT DETECTED

## 2012-09-30 LAB — CBC
HCT: 44.3 % (ref 39.0–52.0)
Hemoglobin: 15.3 g/dL (ref 13.0–17.0)
MCH: 31.2 pg (ref 26.0–34.0)
MCHC: 34.5 g/dL (ref 30.0–36.0)
MCV: 90.4 fL (ref 78.0–100.0)
Platelets: 277 10*3/uL (ref 150–400)
RBC: 4.9 MIL/uL (ref 4.22–5.81)
RDW: 12.6 % (ref 11.5–15.5)
WBC: 6.7 K/uL (ref 4.0–10.5)

## 2012-09-30 LAB — COMPREHENSIVE METABOLIC PANEL
AST: 15 U/L (ref 0–37)
BUN: 5 mg/dL — ABNORMAL LOW (ref 6–23)
CO2: 30 mEq/L (ref 19–32)
Calcium: 9 mg/dL (ref 8.4–10.5)
Chloride: 100 mEq/L (ref 96–112)
Creatinine, Ser: 0.8 mg/dL (ref 0.50–1.35)
GFR calc non Af Amer: >90 mL/min (ref >90)
Total Bilirubin: 0.2 mg/dL — ABNORMAL LOW (ref 0.3–1.2)

## 2012-09-30 LAB — COMPREHENSIVE METABOLIC PANEL WITH GFR
ALT: 11 U/L (ref 0–53)
Albumin: 3.6 g/dL (ref 3.5–5.2)
Alkaline Phosphatase: 86 U/L (ref 39–117)
GFR calc Af Amer: >90 mL/min (ref >90)
Glucose, Bld: 88 mg/dL (ref 70–99)
Potassium: 4.3 meq/L (ref 3.5–5.1)
Sodium: 138 meq/L (ref 135–145)
Total Protein: 7.1 g/dL (ref 6.0–8.3)

## 2012-09-30 LAB — ETHANOL: Alcohol, Ethyl (B): <11 mg/dL (ref 0–11)

## 2012-09-30 LAB — SALICYLATE LEVEL: Salicylate Lvl: <2 mg/dL — ABNORMAL LOW (ref 2.8–20.0)

## 2012-09-30 LAB — ACETAMINOPHEN LEVEL: Acetaminophen (Tylenol), Serum: <15 ug/mL (ref 10–30)

## 2012-09-30 MED ORDER — NAPROXEN 500 MG PO TABS
500.0000 mg | ORAL_TABLET | Freq: Two times a day (BID) | ORAL | Status: DC | PRN
  Administered 2012-10-01: 500 mg via ORAL
  Filled 2012-09-30: qty 1

## 2012-09-30 MED ORDER — ALUM & MAG HYDROXIDE-SIMETH 200-200-20 MG/5ML PO SUSP: 30.0000 mL | ORAL | Status: DC | PRN

## 2012-09-30 MED ORDER — ONDANSETRON HCL 4 MG PO TABS
4.0000 mg | ORAL_TABLET | Freq: Three times a day (TID) | ORAL | Status: DC | PRN
  Administered 2012-09-30: 4 mg via ORAL
  Filled 2012-09-30 (×2): qty 1

## 2012-09-30 MED ORDER — METHOCARBAMOL 500 MG PO TABS
500.0000 mg | ORAL_TABLET | Freq: Three times a day (TID) | ORAL | Status: DC | PRN
  Administered 2012-09-30 – 2012-10-01 (×2): 500 mg via ORAL
  Filled 2012-09-30 (×2): qty 1

## 2012-09-30 MED ORDER — LOPERAMIDE HCL 2 MG PO CAPS: 2.0000 mg | ORAL_CAPSULE | ORAL | Status: DC | PRN

## 2012-09-30 MED ORDER — ONDANSETRON 4 MG PO TBDP
4.0000 mg | ORAL_TABLET | Freq: Once | ORAL | Status: AC
  Filled 2012-09-30: qty 1

## 2012-09-30 MED ORDER — LORAZEPAM 1 MG PO TABS
1.0000 mg | ORAL_TABLET | Freq: Three times a day (TID) | ORAL | Status: DC | PRN
  Administered 2012-09-30 – 2012-10-01 (×2): 1 mg via ORAL
  Filled 2012-09-30 (×3): qty 1

## 2012-09-30 MED ORDER — ONDANSETRON 4 MG PO TBDP
4.0000 mg | ORAL_TABLET | Freq: Four times a day (QID) | ORAL | Status: DC | PRN
  Administered 2012-09-30 – 2012-10-01 (×2): 4 mg via ORAL
  Filled 2012-09-30: qty 1

## 2012-09-30 MED ORDER — NICOTINE 21 MG/24HR TD PT24
21.0000 mg | MEDICATED_PATCH | Freq: Every day | TRANSDERMAL | Status: DC
  Administered 2012-09-30 – 2012-10-01 (×2): 21 mg via TRANSDERMAL
  Filled 2012-09-30 (×2): qty 1

## 2012-09-30 MED ORDER — DICYCLOMINE HCL 20 MG PO TABS
20.0000 mg | ORAL_TABLET | Freq: Four times a day (QID) | ORAL | Status: DC | PRN
  Administered 2012-09-30: 20 mg via ORAL
  Filled 2012-09-30: qty 1

## 2012-09-30 MED ORDER — NICOTINE 21 MG/24HR TD PT24: 21.0000 mg | MEDICATED_PATCH | Freq: Once | TRANSDERMAL | Status: DC

## 2012-09-30 MED ORDER — IBUPROFEN 200 MG PO TABS: 600.0000 mg | ORAL_TABLET | Freq: Three times a day (TID) | ORAL | Status: DC | PRN

## 2012-09-30 MED ORDER — HYDROXYZINE HCL 25 MG PO TABS
25.0000 mg | ORAL_TABLET | Freq: Four times a day (QID) | ORAL | Status: DC | PRN
  Administered 2012-09-30 – 2012-10-01 (×2): 25 mg via ORAL
  Filled 2012-09-30 (×2): qty 1

## 2012-09-30 MED ORDER — ACETAMINOPHEN 325 MG PO TABS: 650.0000 mg | ORAL_TABLET | ORAL | Status: DC | PRN

## 2012-09-30 MED ORDER — ZOLPIDEM TARTRATE 5 MG PO TABS
5.0000 mg | ORAL_TABLET | Freq: Every evening | ORAL | Status: DC | PRN
  Administered 2012-09-30: 5 mg via ORAL
  Filled 2012-09-30: qty 1

## 2012-09-30 NOTE — ED Notes (Signed)
Pt complaining of muscle spasms, chills, and generalized aching.  Pt medicated.
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
## 2012-09-30 NOTE — ED Notes (Signed)
Pt BIB mother. Pt states he wants detox from heroin. Pt states he last used yesterday. Pt denies any other alcohol or drug usage. Pt tearful, anxious, shaking. Pt states he has had n/v today. Pt calm, cooperative. Pt denies SI.

## 2012-09-30 NOTE — ED Notes (Signed)
Pt blood samples (lt green and green) obtained from left arm, samples (lavender and gold) obtained from right arm

## 2012-09-30 NOTE — ED Provider Notes (Signed)
Medical screening examination/treatment/procedure(s) were performed by non-physician practitioner and as supervising physician I was immediately available for consultation/collaboration.   Dustin Castillo. Oletta Lamas, MD 09/30/12 2048

## 2012-10-01 DIAGNOSIS — F1110 Opioid abuse, uncomplicated: Secondary | ICD-10-CM

## 2012-10-01 MED ORDER — LORAZEPAM 1 MG PO TABS
1.0000 mg | ORAL_TABLET | Freq: Once | ORAL | Status: AC
  Administered 2012-10-01: 1 mg via ORAL
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
## 2012-10-01 NOTE — Progress Notes (Signed)
Pt denies SI/HI/AH/VH. Per discussion with psychiatrist patient stable for discharge. Pt requesting outpatient resources.  EDP and RN aware.   Frutoso Schatz [phone]  ED CSW 10/01/2012 11:05am

## 2012-10-01 NOTE — ED Notes (Signed)
Pt complaining of anxiety.

## 2012-10-01 NOTE — ED Notes (Signed)
Pt's mother called to check on pt's status.  Mother states that she will visit patient after she gets off work.  She states that it will be after 3pm.

## 2012-10-01 NOTE — ED Notes (Signed)
Pt complaining of anxiety and restlessness.

## 2012-10-01 NOTE — Progress Notes (Signed)
[facility] did not get to see patient but will be sending information about the [facility] [employer] Card program, using the address provided.  °

## 2012-10-01 NOTE — ED Provider Notes (Signed)
Pt has been evaluated by Psych Dr. Ladona Ridgel: pt denies SI/HI, pt does not want inpt detox and can be discharged. He requests EDP to d/c pt. Pt d/c with outpt resources.   Laray Anger, DO 10/01/12 1156

## 2013-01-07 IMAGING — CT CT ABD-PELV W/ CM
2 of 3 series · 16 of 46 positions shown, 18 images · IV contrast (Omnipaque 300)
Comparison: None.

CLINICAL DATA: Left flank pain, rectal bleeding, nausea

CT ABDOMEN AND PELVIS WITH CONTRAST
TECHNIQUE: Multidetector CT imaging of the abdomen and pelvis was
performed following the standard protocol during bolus
administration of intravenous contrast.
Contrast: 100mL OMNIPAQUE IOHEXOL 300 MG/ML IV SOLN

[Series 2: abd_pel_with 5.0 b40f · axial · 0.62mm/px · z∈[-572,-162]mm · 13 of 96 slices shown, 15 images]
[im 7/96  soft-tissue]
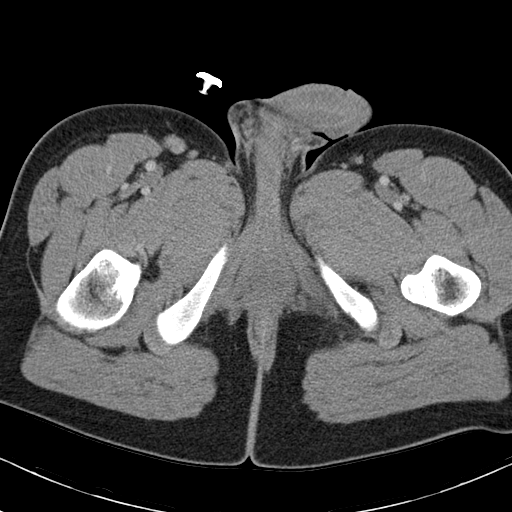
[im 7/96  bone]
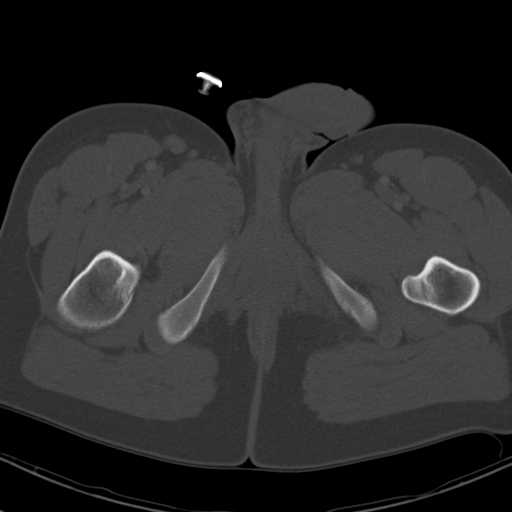
[im 13/96  soft-tissue]
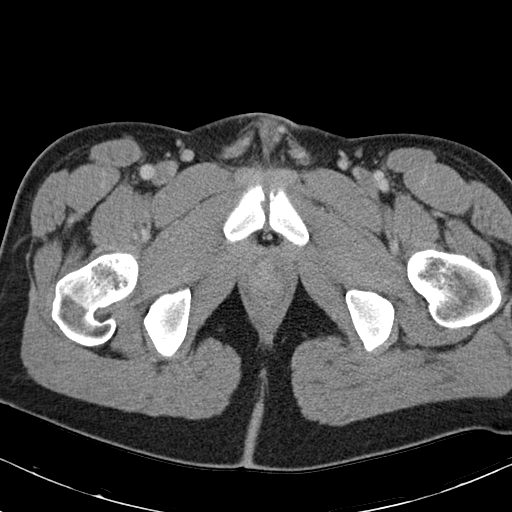
[im 19/96  soft-tissue]
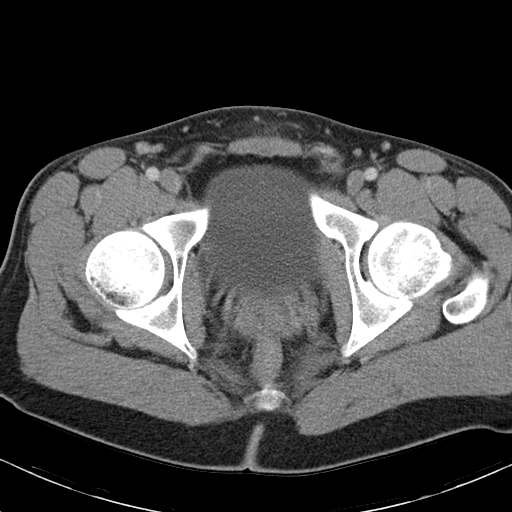
[im 28/96  soft-tissue]
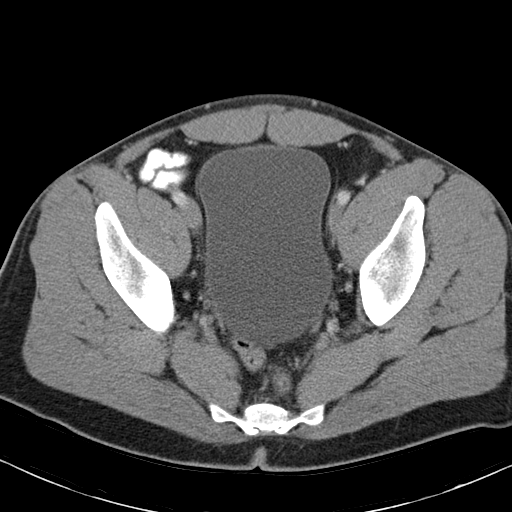
[im 34/96  soft-tissue]
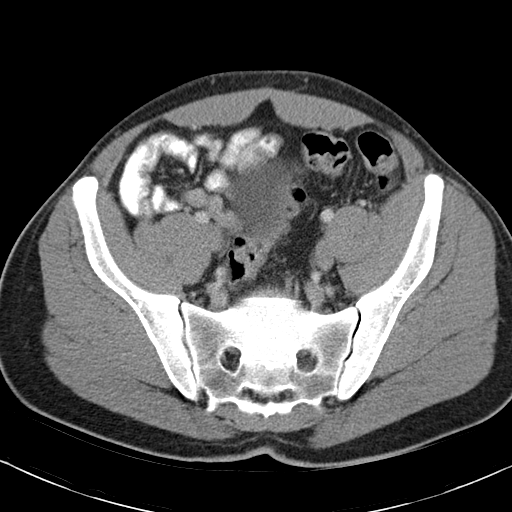
[im 40/96  soft-tissue]
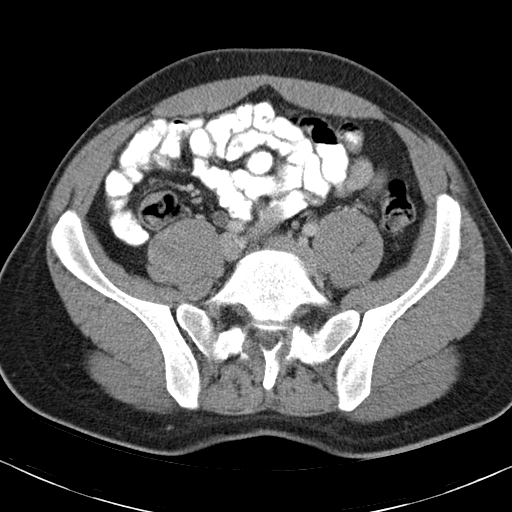
[im 50/96  soft-tissue]
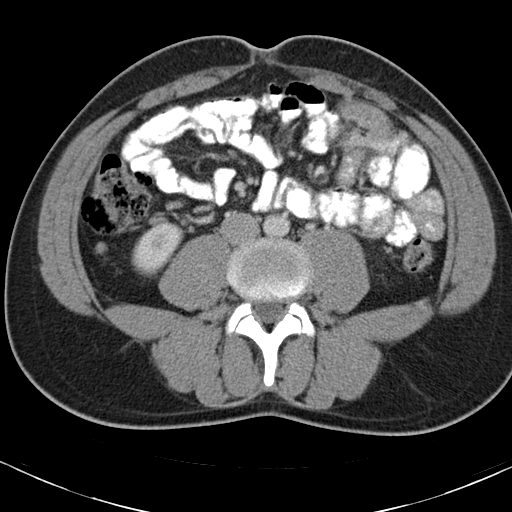
[im 56/96  soft-tissue]
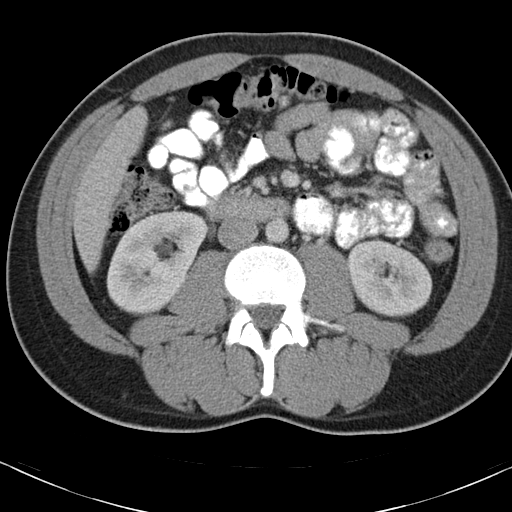
[im 62/96  soft-tissue]
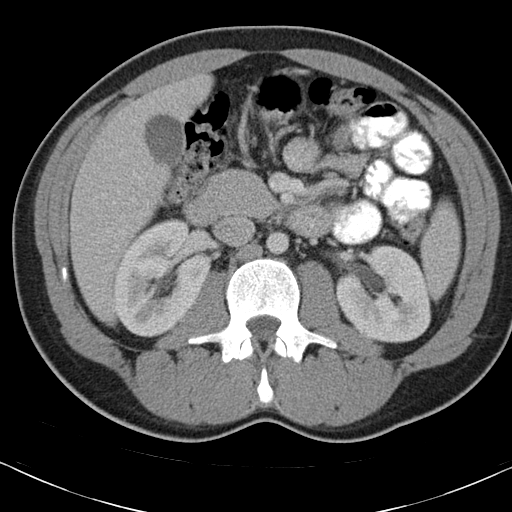
[im 62/96  bone]
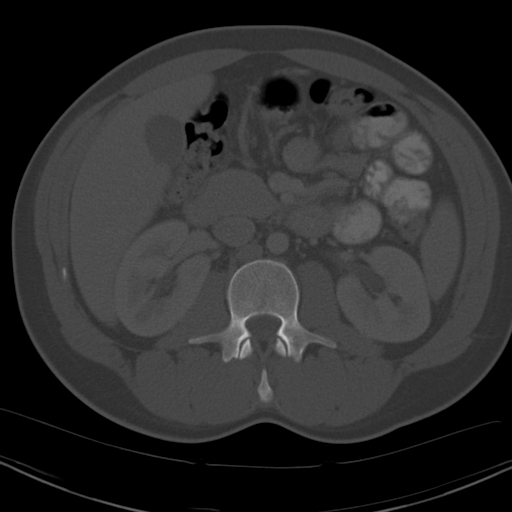
[im 68/96  soft-tissue]
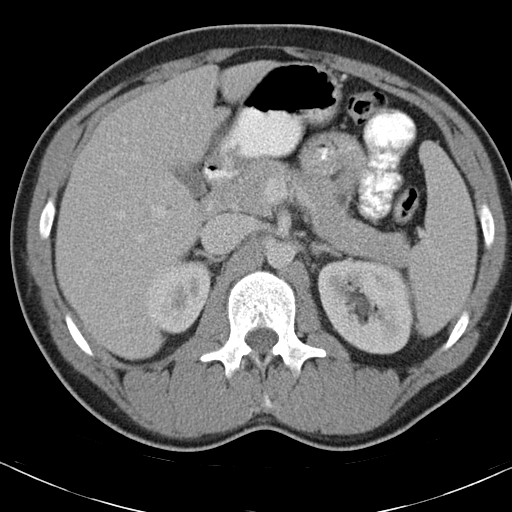
[im 77/96  soft-tissue]
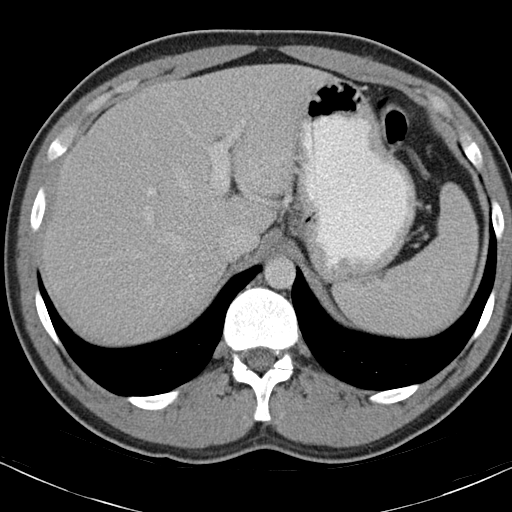
[im 83/96  soft-tissue]
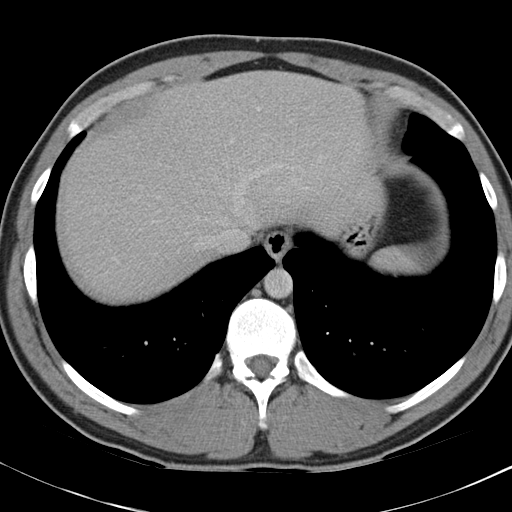
[im 89/96  soft-tissue]
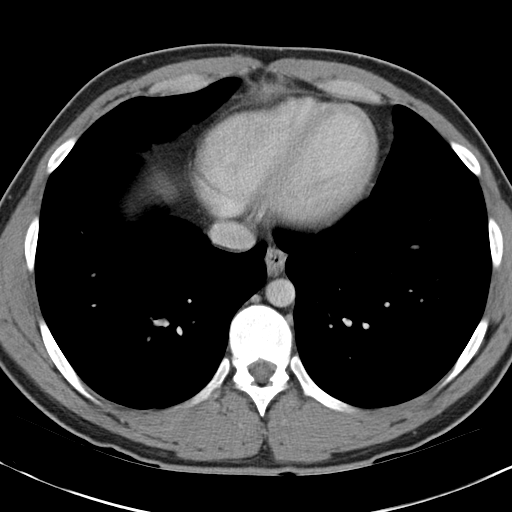

[Series 4: abd_pel_with 3.0 spo cor · coronal · 0.61mm/px · 3 of 84 slices shown]
[im 28/84  soft-tissue]
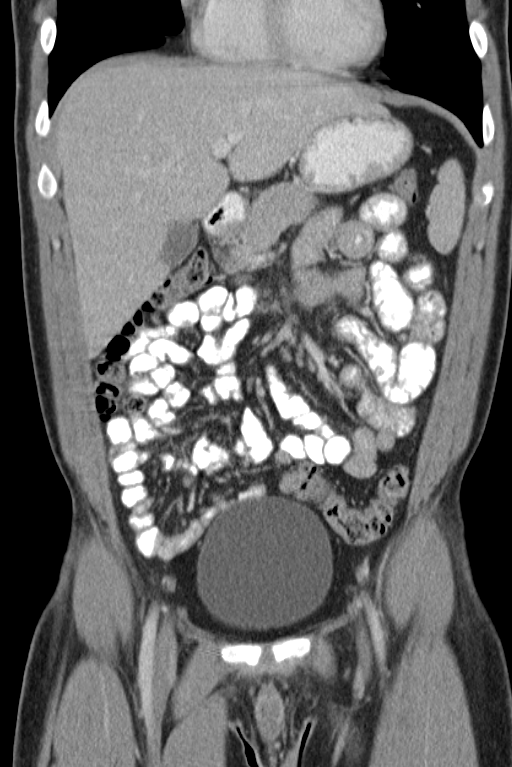
[im 37/84  soft-tissue]
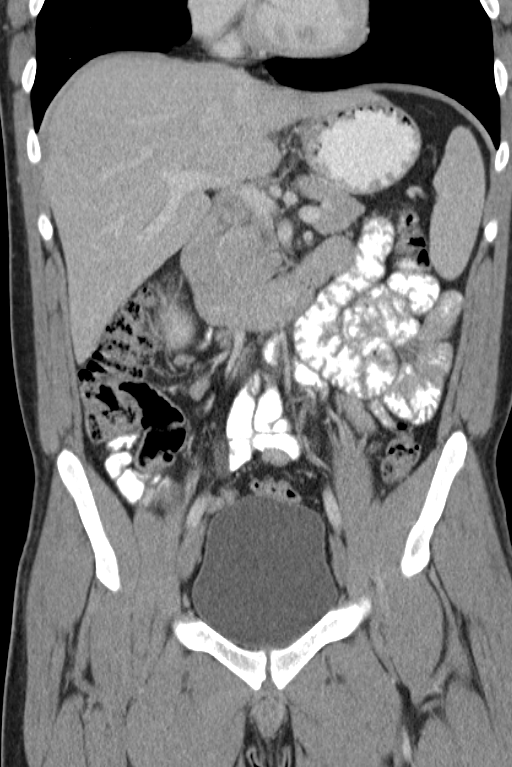
[im 47/84  soft-tissue]
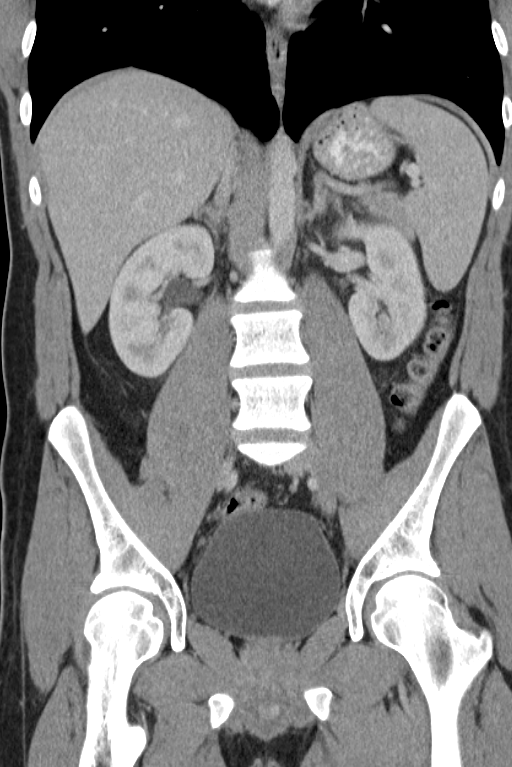

[16 of 46 positions shown; findings below may reference images not displayed]

FINDINGS: The lung bases are clear.  The liver enhances with no
focal abnormality and no ductal dilatation is seen.  No calcified
gallstones are noted.  The pancreas is normal in size and the
pancreatic duct is not dilated.  The adrenal glands and spleen are
unremarkable.  The stomach is moderately distended with contrast
media and is unremarkable.  The kidneys enhance with no calculus or
mass, and no hydronephrosis is seen.  The abdominal aorta is normal
in caliber.

The appendix is well visualized in the right abdomen and fills with
air normally.  The terminal ileum is unremarkable.  The urinary
bladder is urine distended with no abnormality noted.  The distal
ureters are normal in caliber and no distal ureteral calculus is
seen.  No fluid is noted within the pelvis.  No abnormality of the
colon is seen.  No bony abnormality is seen.
IMPRESSION: 1.  No explanation for the patient's pain is seen.  No calculi are
noted and there is no evidence of hydronephrosis.
2.  The appendix and terminal ileum are unremarkable.

## 2013-04-22 ENCOUNTER — Telehealth: Payer: Self-pay
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
## 2013-04-22 NOTE — Telephone Encounter (Signed)
Pt states that the rx that he was given for pain has caused him to be unable to sleep, pt would like to know if he can get something different. Best# [phone] (work number)

## 2013-04-22 NOTE — Telephone Encounter (Signed)
This should not be an EPIC message- printed to pull chart and give to provider.

## 2013-07-25 ENCOUNTER — Ambulatory Visit (INDEPENDENT_AMBULATORY_CARE_PROVIDER_SITE_OTHER): Payer: Self-pay | Admitting: Family Medicine

## 2013-07-25 ENCOUNTER — Encounter: Payer: Self-pay | Admitting: Family Medicine

## 2013-07-25 VITALS — BP 134/80 | Temp 98.5°F | Ht 68.0 in | Wt 194.0 lb

## 2013-07-25 DIAGNOSIS — R21 Rash and other nonspecific skin eruption: Secondary | ICD-10-CM

## 2013-07-25 DIAGNOSIS — B86 Scabies: Secondary | ICD-10-CM

## 2013-07-25 MED ORDER — PREDNISONE 20 MG PO TABS: ORAL_TABLET | ORAL | Status: DC

## 2013-07-25 NOTE — Progress Notes (Signed)
   Subjective:    Patient ID: Dustin Castillo, male    DOB: 03-09-1985, 28 y.o.   MRN: [id number]  Rash This is a new problem. Episode onset: Last Monday. The problem has been gradually worsening since onset. The affected locations include the torso, groin, left arm and right arm. The rash is characterized by redness and itchiness. Associated symptoms include congestion and rhinorrhea. Past treatments include topical steroids, anti-itch cream and antihistamine. The treatment provided mild relief. His past medical history is significant for allergies.    Significant rash over the past week  Review of Systems  HENT: Positive for congestion and rhinorrhea.   Skin: Positive for rash.       Objective:   Physical Exam Lungs clear heart regular Erythematous rash on the flanks some in the left groin also in the right shoulder multiple papules with it no pustules consistent with the possibility of scabies       Assessment & Plan:  Possible scabies-permethrin cream as directed followup if ongoing troubles

## 2013-09-25 IMAGING — US US ABDOMEN COMPLETE
1 series · 14 of 25 positions shown · non-contrast
Comparison: 03/18/2011 CT

CLINICAL DATA: Right upper quadrant abdominal pain

COMPLETE ABDOMINAL ULTRASOUND

[Series 1: us abdomen complete · 0.28mm/px · 14 of 101 slices shown]
[im 1/101]
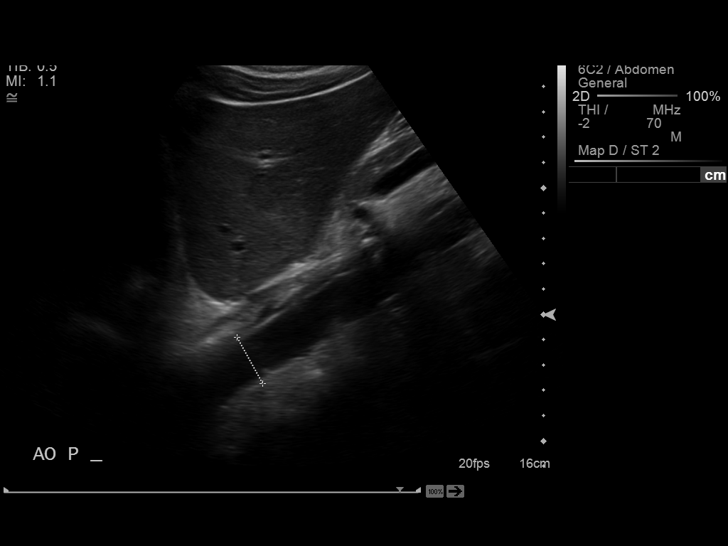
[im 9/101]
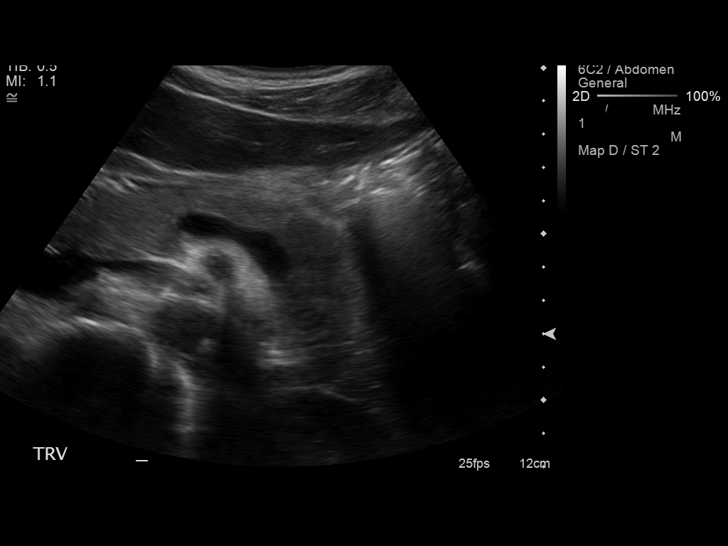
[im 17/101]
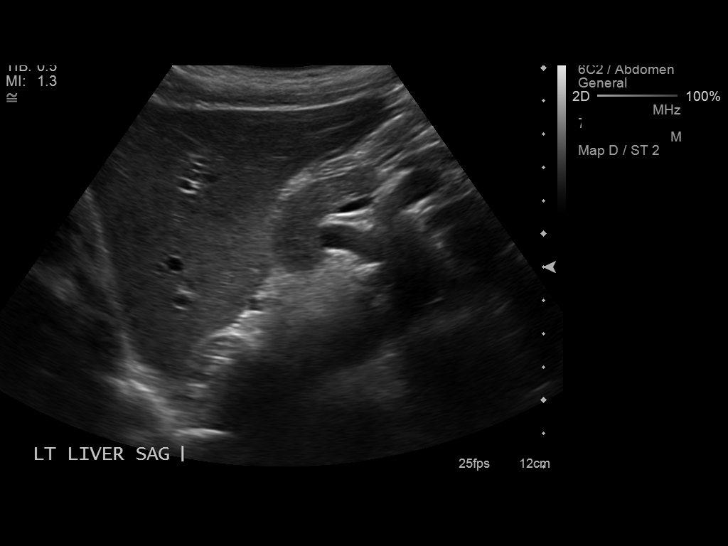
[im 26/101]
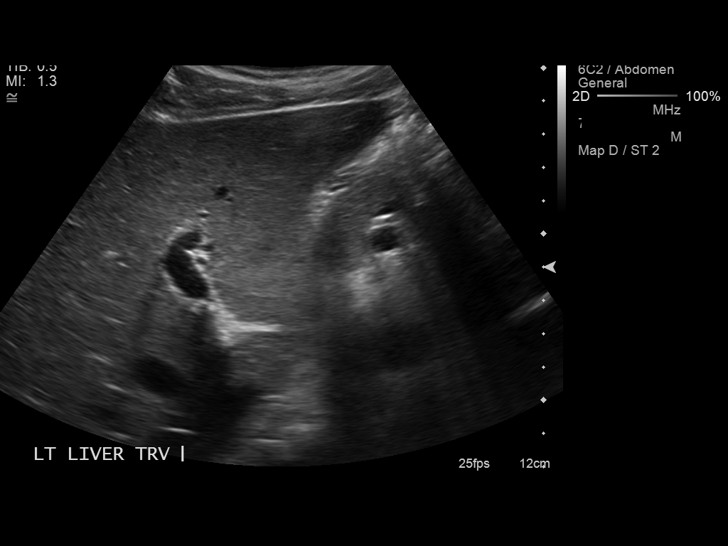
[im 34/101]
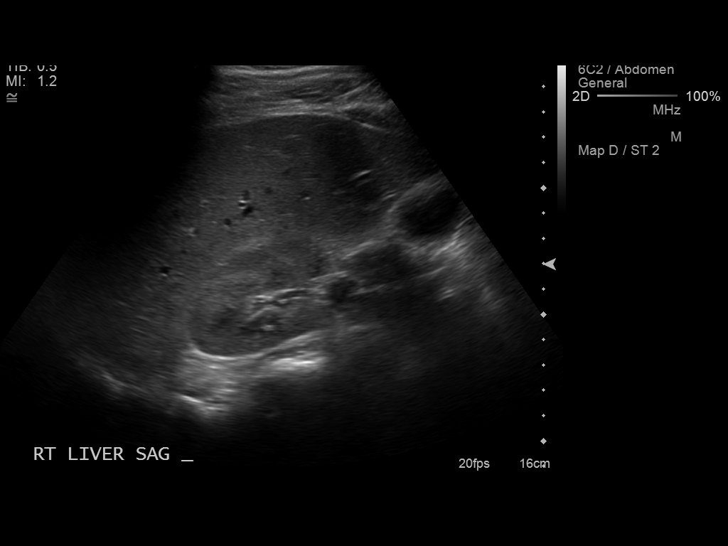
[im 38/101]
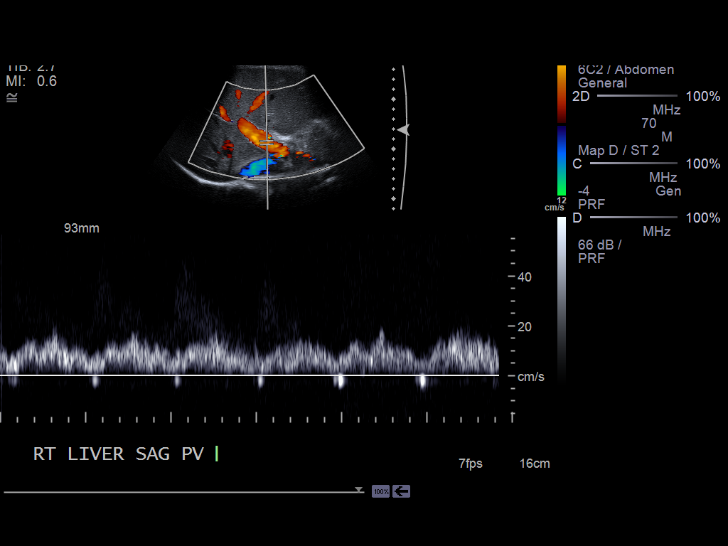
[im 46/101]
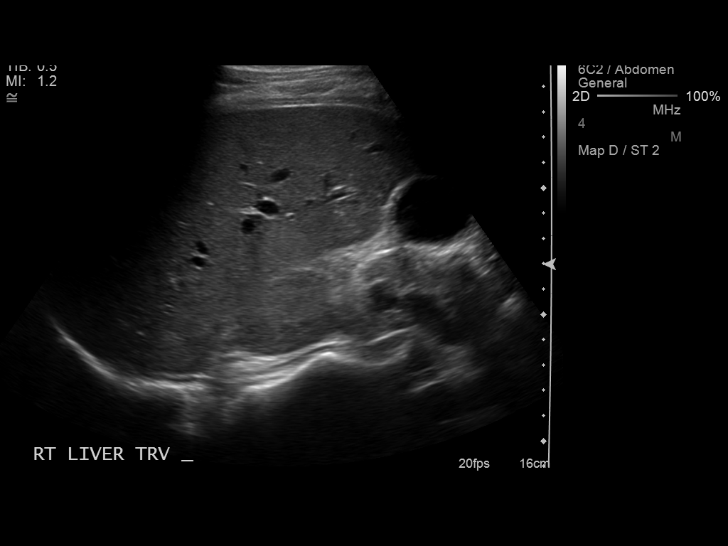
[im 55/101]
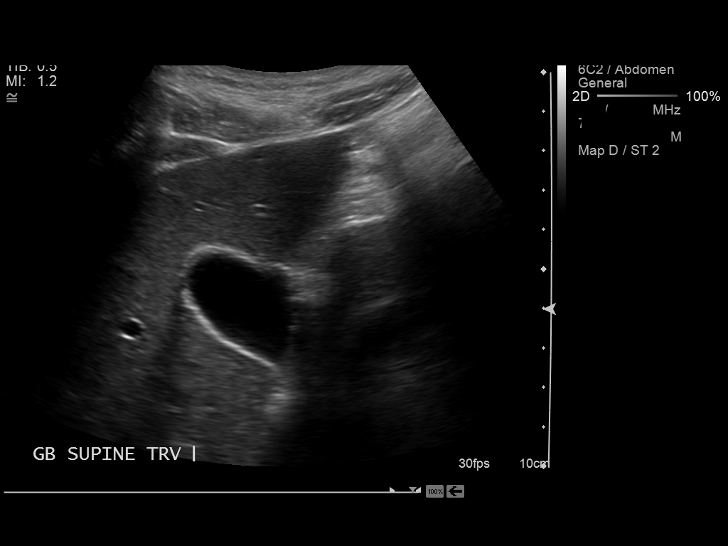
[im 63/101]
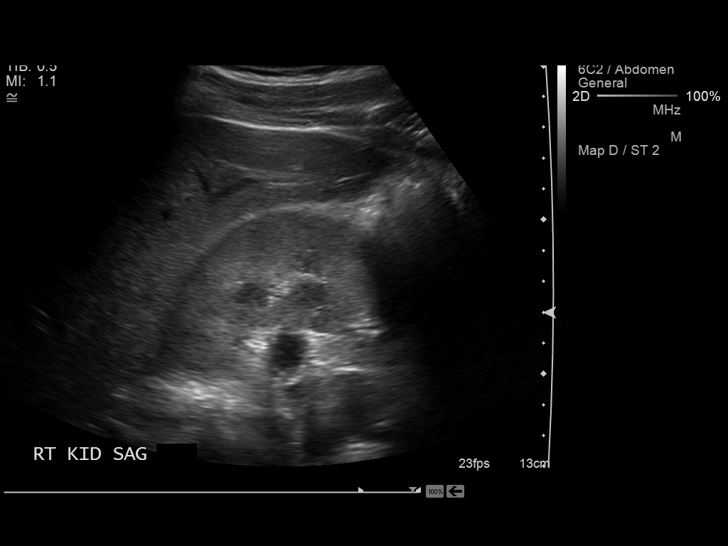
[im 67/101]
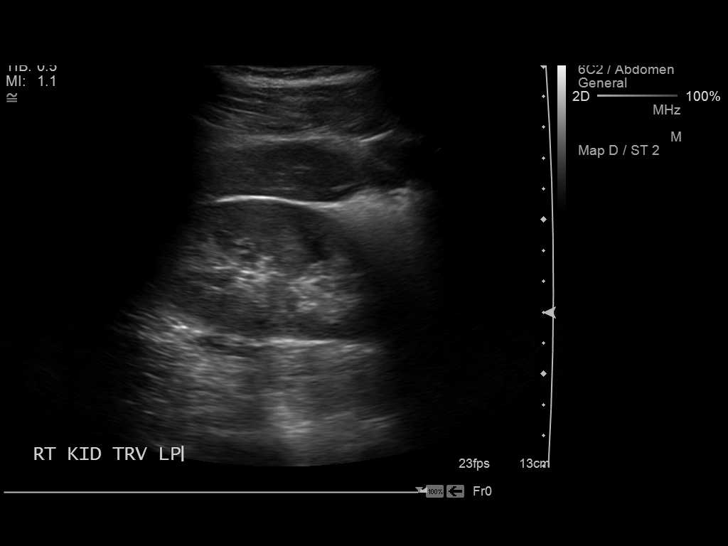
[im 76/101]
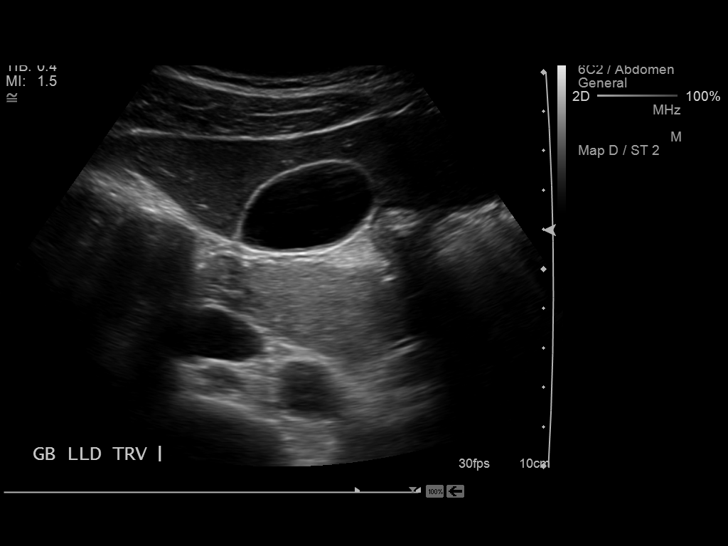
[im 84/101]
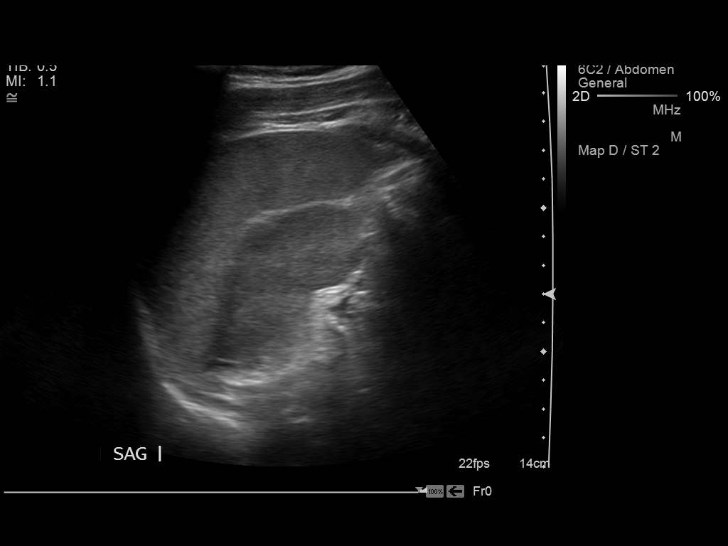
[im 92/101]
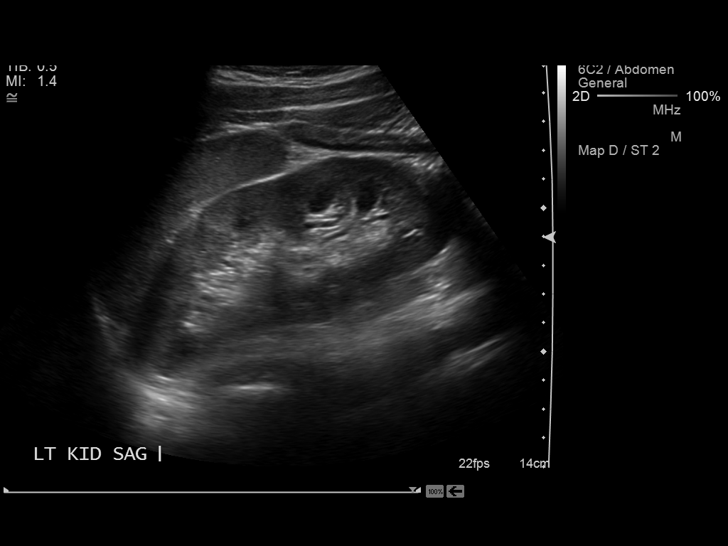
[im 101/101]
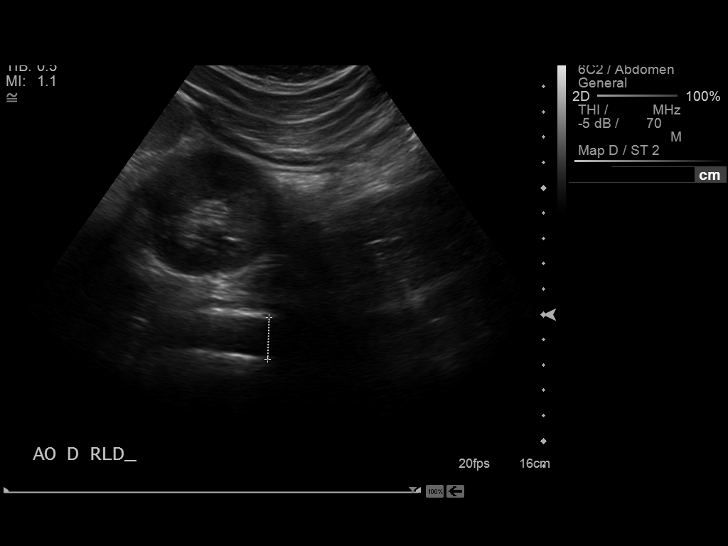

[14 of 25 positions shown; findings below may reference images not displayed]

FINDINGS: Gallbladder:  No gallstones, gallbladder wall thickening, or
pericholecystic fluid.

Common bile duct:  Measures 3 mm, within normal limits.

Liver:  No focal lesion identified.  Within normal limits in
parenchymal echogenicity.

IVC:  Appears normal.

Pancreas:  No focal abnormality seen.

Spleen:  Measures 10.5 cm oblique.  No focal abnormality.

Right Kidney:  Measures 11.4 cm in length.  No hydronephrosis or
focal abnormality.

Left Kidney:  Measures 11.1 cm length.  No hydronephrosis or focal
abnormality.

Abdominal aorta:  No aneurysm identified.
IMPRESSION: Negative abdominal ultrasound.

## 2013-09-26 ENCOUNTER — Encounter: Payer: Self-pay | Admitting: Family Medicine

## 2013-09-26 ENCOUNTER — Ambulatory Visit (INDEPENDENT_AMBULATORY_CARE_PROVIDER_SITE_OTHER): Payer: Self-pay | Admitting: Family Medicine

## 2013-09-26 VITALS — BP 122/80 | Temp 98.0°F | Ht 68.0 in | Wt 189.1 lb

## 2013-09-26 DIAGNOSIS — B029 Zoster without complications: Secondary | ICD-10-CM

## 2013-09-26 DIAGNOSIS — L02219 Cutaneous abscess of trunk, unspecified: Secondary | ICD-10-CM

## 2013-09-26 DIAGNOSIS — L03319 Cellulitis of trunk, unspecified: Secondary | ICD-10-CM

## 2013-09-26 DIAGNOSIS — L02212 Cutaneous abscess of back [any part, except buttock]: Secondary | ICD-10-CM

## 2013-09-26 MED ORDER — HYDROCODONE-ACETAMINOPHEN 7.5-325 MG PO TABS: 1.0000 | ORAL_TABLET | Freq: Four times a day (QID) | ORAL | Status: DC | PRN

## 2013-09-26 MED ORDER — ACYCLOVIR 800 MG PO TABS: 800.0000 mg | ORAL_TABLET | Freq: Every day | ORAL | Status: DC

## 2013-09-26 MED ORDER — SULFAMETHOXAZOLE-TMP DS 800-160 MG PO TABS: 1.0000 | ORAL_TABLET | Freq: Two times a day (BID) | ORAL | Status: DC

## 2013-09-26 NOTE — Progress Notes (Signed)
   Subjective:    Patient ID: KOHNER ORLICK, male    DOB: 04-Sep-1985, 28 y.o.   MRN: [id number]  Rash This is a new problem. The current episode started 1 to 4 weeks ago. The problem is unchanged. The affected locations include the back. The rash is characterized by redness and pain. He was exposed to nothing. Treatments tried: Bactrim, Neosporin, Ibuprofen. The treatment provided no relief.   Patient states he has no other concerns at this time.     Review of Systems  Skin: Positive for rash.   denies fever chills relates pain and discomfort.     Objective:   Physical Exam This is a patient has a papular rash on the lower right back region near the upper buttock. There is multiple papules with appearance of pus with the papules. There is no abscess with this. No other rashes are seen  This area was numbed with 1% lidocaine. A small incision was made and minimal pus obtained culture was sent     Assessment & Plan:  Probable shingles with secondary staph infection Bactrim DS twice a day for 10 days, acyclovir 800 mg 5 times daily for 7 days, Vicodin for pain caution drowsiness. If ongoing trouble followup.

## 2013-09-28 ENCOUNTER — Ambulatory Visit (INDEPENDENT_AMBULATORY_CARE_PROVIDER_SITE_OTHER): Payer: Self-pay | Admitting: Family Medicine

## 2013-09-28 VITALS — BP 130/88 | Ht 68.0 in | Wt 185.0 lb

## 2013-09-28 DIAGNOSIS — L0390 Cellulitis, unspecified: Principal | ICD-10-CM

## 2013-09-28 DIAGNOSIS — B958 Unspecified staphylococcus as the cause of diseases classified elsewhere: Secondary | ICD-10-CM

## 2013-09-28 DIAGNOSIS — L0291 Cutaneous abscess, unspecified: Secondary | ICD-10-CM

## 2013-09-28 MED ORDER — CLINDAMYCIN HCL 300 MG PO CAPS
300.0000 mg | ORAL_CAPSULE | Freq: Three times a day (TID) | ORAL | Status: DC
Start: 2013-09-28 — End: 2013-09-30

## 2013-09-28 NOTE — Progress Notes (Signed)
   Subjective:    Patient ID: Dustin Castillo, male    DOB: 02/10/1985, 28 y.o.   MRN: [id number]  HPI Patient is here today with what looks like a very large boil. Located in top center of tail bone. The area is very inflamed. Patient stayed home from work today using heat compress & taking pain medication. Patient states that nothing has helped  & he is a lot of pain.    Review of Systems Denies fever chills weight soreness pain and discomfort please see previous    Objective:   Physical Exam He has multiple pustules on the back with secondary infection of staph he is already on Bactrim       Assessment & Plan:  With his permission both areas were numb with lidocaine #11 blade was used to obtain some pus compress was placed  Clindamycin 3 times a day for 7 days  Finish out Bactrim  Warm compresses frequently if he fails this followup

## 2013-09-29 LAB — WOUND CULTURE
GRAM STAIN: NONE SEEN
Gram Stain: NONE SEEN

## 2013-09-30 MED ORDER — DOXYCYCLINE HYCLATE 100 MG PO TABS: 100.0000 mg | ORAL_TABLET | Freq: Two times a day (BID) | ORAL | Status: DC

## 2013-09-30 NOTE — Addendum Note (Signed)
Addended by: Margaretha Sheffield on: 09/30/2013 05:06 PM   Modules accepted: Orders

## 2014-07-04 ENCOUNTER — Ambulatory Visit (INDEPENDENT_AMBULATORY_CARE_PROVIDER_SITE_OTHER): Payer: BLUE CROSS/BLUE SHIELD | Admitting: Physician Assistant

## 2014-07-04 VITALS — BP 118/80 | HR 79 | Temp 98.7°F | Resp 18 | Ht 68.0 in | Wt 212.0 lb

## 2014-07-04 DIAGNOSIS — R05 Cough: Secondary | ICD-10-CM

## 2014-07-04 DIAGNOSIS — R059 Cough, unspecified: Secondary | ICD-10-CM

## 2014-07-04 DIAGNOSIS — R062 Wheezing: Secondary | ICD-10-CM

## 2014-07-04 DIAGNOSIS — R0989 Other specified symptoms and signs involving the circulatory and respiratory systems: Secondary | ICD-10-CM | POA: Diagnosis not present

## 2014-07-04 MED ORDER — ALBUTEROL SULFATE HFA 108 (90 BASE) MCG/ACT IN AERS: 2.0000 | INHALATION_SPRAY | RESPIRATORY_TRACT | Status: DC | PRN

## 2014-07-04 MED ORDER — BENZONATATE 100 MG PO CAPS: 100.0000 mg | ORAL_CAPSULE | Freq: Three times a day (TID) | ORAL | Status: DC | PRN

## 2014-07-04 MED ORDER — ALBUTEROL SULFATE (2.5 MG/3ML) 0.083% IN NEBU
2.5000 mg | INHALATION_SOLUTION | Freq: Once | RESPIRATORY_TRACT | Status: AC
  Administered 2014-07-04: 2.5 mg via RESPIRATORY_TRACT

## 2014-07-04 MED ORDER — HYDROCODONE-HOMATROPINE 5-1.5 MG/5ML PO SYRP: 5.0000 mL | ORAL_SOLUTION | Freq: Three times a day (TID) | ORAL | Status: DC | PRN

## 2014-07-04 MED ORDER — IPRATROPIUM BROMIDE 0.02 % IN SOLN
0.5000 mg | Freq: Once | RESPIRATORY_TRACT | Status: AC
  Administered 2014-07-04: 0.5 mg via RESPIRATORY_TRACT

## 2014-07-04 NOTE — Progress Notes (Signed)
   Subjective:    Patient ID: Dustin BoxerMichael S Castillo, male    DOB: 26-Mar-1985, 29 y.o.   MRN: [id number]  Chief Complaint  Patient presents with  . Cough    x 2 days  . chest congestion    x 2 days   Medications, allergies, past medical history, surgical history, family history, social history and problem list reviewed and updated.  HPI  [age] yom presents with two day h/o non prod cough and chest congestion.  Sx started two nights ago with cough. Didn't sleep well. Cough has persisted and kept him up last night. Non productive. Feels that chest is congested with the coughing spells. Feels head congested past couple days. Thinks he might be wheezing. No hx asthma.   Denies fevers, chills, abd pain, n/v, diarrhea. Denies st, otalgia. Took nyquil last night with no relief.   Review of Systems See HPI.     Objective:   Physical Exam  Constitutional: He appears well-developed and well-nourished.  Non-toxic appearance. He does not have a sickly appearance. He does not appear ill. No distress.  BP 118/80 mmHg  Pulse 79  Temp(Src) 98.7 F (37.1 C) (Oral)  Resp 18  Ht 5\' 8"  (1.727 m)  Wt 212 lb (96.163 kg)  BMI 32.24 kg/m2  SpO2 98%   HENT:  Right Ear: Tympanic membrane normal.  Left Ear: Tympanic membrane normal.  Nose: No mucosal edema or rhinorrhea. Right sinus exhibits no maxillary sinus tenderness and no frontal sinus tenderness. Left sinus exhibits no maxillary sinus tenderness and no frontal sinus tenderness.  Mouth/Throat: Uvula is midline, oropharynx is clear and moist and mucous membranes are normal.  Pulmonary/Chest: Effort normal. No tachypnea. No respiratory distress. He has no decreased breath sounds. He has wheezes in the right upper field, the right middle field, the right lower field, the left upper field, the left middle field and the left lower field. He has no rhonchi. He has no rales.  Lymphadenopathy:       Head (right side): No submental, no submandibular and no  tonsillar adenopathy present.       Head (left side): No submental, no submandibular and no tonsillar adenopathy present.    He has no cervical adenopathy.   Duoneb breathing treatment given.  Lung sounds post duoneb: Normal. Moving air well. No wheezes appreciated.      Assessment & Plan:   [age] yom presents with two day h/o non prod cough and chest congestion.  Cough Chest congestion Wheezing - Plan: albuterol (PROVENTIL) (2.5 MG/3ML) 0.083% nebulizer solution 2.5 mg, ipratropium (ATROVENT) nebulizer solution 0.5 mg --wheezing/intermittent asthma likely leading to cough --improved with duoneb in clinic --hycodan at night, tessalon during day --albuterol prn at home --if no relief with home albuterol rtc/er asap --pt instructed that if using albuterol more than few times week or most nights, needs further workup for asthma   [name], PA-C Physician Assistant-Certified [facility]  07/04/2014 11:56 AM

## 2014-07-04 NOTE — Patient Instructions (Signed)
You were wheezing on exam today.  For the cough, please take the hycodan at night to help you sleep, keep in mind this will make you sleepy. Take the tessalon during the day for cough.  When you feel like you're wheezing use the albuteorl inhaler 2 puffs as needed every 4 hours. If you don't improve after a couple rounds of the albuterol be sure to come in to be seen asap.

## 2014-08-28 ENCOUNTER — Encounter (HOSPITAL_COMMUNITY): Payer: Self-pay | Admitting: Emergency Medicine

## 2014-08-28 ENCOUNTER — Emergency Department (HOSPITAL_COMMUNITY)
Admission: EM | Admit: 2014-08-28 | Discharge: 2014-08-28 | Disposition: A | Discharge status: Home / Self Care | Payer: BLUE CROSS/BLUE SHIELD | Attending: Emergency Medicine | Admitting: Emergency Medicine

## 2014-08-28 DIAGNOSIS — Z720 Tobacco use: Secondary | ICD-10-CM | POA: Insufficient documentation

## 2014-08-28 DIAGNOSIS — Z880 Allergy status to penicillin: Secondary | ICD-10-CM | POA: Insufficient documentation

## 2014-08-28 DIAGNOSIS — M5412 Radiculopathy, cervical region: Secondary | ICD-10-CM | POA: Insufficient documentation

## 2014-08-28 DIAGNOSIS — Z79899 Other long term (current) drug therapy: Secondary | ICD-10-CM | POA: Insufficient documentation

## 2014-08-28 MED ORDER — HYDROCODONE-ACETAMINOPHEN 5-325 MG PO TABS: 1.0000 | ORAL_TABLET | Freq: Four times a day (QID) | ORAL | Status: DC | PRN

## 2014-08-28 MED ORDER — PREDNISONE 20 MG PO TABS: 40.0000 mg | ORAL_TABLET | Freq: Every day | ORAL | Status: DC

## 2014-08-28 NOTE — Discharge Instructions (Signed)

## 2014-08-28 NOTE — ED Notes (Signed)
Patient states he has left shoulder pain that radiates down to his entire arm.  He is having pain in the cervical spine area.  He states he was pulling a motor out of the truck and he attempted to catch the motor and he felt pain in his neck.  The pain has continued to increase over night.  He denies falling.  No N/V.  No blackouts or dizzy spells.
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
## 2014-12-27 ENCOUNTER — Encounter (HOSPITAL_COMMUNITY): Payer: Self-pay | Admitting: Family Medicine

## 2014-12-27 ENCOUNTER — Emergency Department (HOSPITAL_COMMUNITY)
Admission: EM | Admit: 2014-12-27 | Discharge: 2014-12-27 | Discharge status: Against Medical Advice | Payer: BLUE CROSS/BLUE SHIELD | Attending: Emergency Medicine | Admitting: Emergency Medicine

## 2014-12-27 DIAGNOSIS — F1910 Other psychoactive substance abuse, uncomplicated: Secondary | ICD-10-CM | POA: Insufficient documentation

## 2014-12-27 DIAGNOSIS — F17210 Nicotine dependence, cigarettes, uncomplicated: Secondary | ICD-10-CM | POA: Insufficient documentation

## 2014-12-27 DIAGNOSIS — Z880 Allergy status to penicillin: Secondary | ICD-10-CM | POA: Insufficient documentation

## 2014-12-27 DIAGNOSIS — Z79899 Other long term (current) drug therapy: Secondary | ICD-10-CM | POA: Insufficient documentation

## 2014-12-27 DIAGNOSIS — Z7952 Long term (current) use of systemic steroids: Secondary | ICD-10-CM | POA: Insufficient documentation
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
## 2014-12-27 NOTE — ED Notes (Addendum)
Pt left AMA without being seen by a EDP pt did not consent to dc vitals.

## 2014-12-27 NOTE — ED Notes (Signed)
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 Pt denies being SI/HI. Pt reports he uses daily. Last used: yesterday. Pt reports he needs to detox so that [place]

## 2015-12-04 ENCOUNTER — Emergency Department (HOSPITAL_COMMUNITY)
Admission: EM | Admit: 2015-12-04 | Discharge: 2015-12-04 | Disposition: A | Discharge status: Home / Self Care | Payer: BLUE CROSS/BLUE SHIELD | Attending: Emergency Medicine | Admitting: Emergency Medicine

## 2015-12-04 ENCOUNTER — Encounter (HOSPITAL_COMMUNITY): Payer: Self-pay | Admitting: *Deleted

## 2015-12-04 DIAGNOSIS — Z79899 Other long term (current) drug therapy: Secondary | ICD-10-CM | POA: Insufficient documentation

## 2015-12-04 DIAGNOSIS — F17210 Nicotine dependence, cigarettes, uncomplicated: Secondary | ICD-10-CM | POA: Insufficient documentation

## 2015-12-04 DIAGNOSIS — Y929 Unspecified place or not applicable: Secondary | ICD-10-CM | POA: Insufficient documentation

## 2015-12-04 DIAGNOSIS — Y999 Unspecified external cause status: Secondary | ICD-10-CM | POA: Insufficient documentation

## 2015-12-04 DIAGNOSIS — M62838 Other muscle spasm: Secondary | ICD-10-CM

## 2015-12-04 DIAGNOSIS — X58XXXA Exposure to other specified factors, initial encounter: Secondary | ICD-10-CM | POA: Insufficient documentation

## 2015-12-04 DIAGNOSIS — Y9372 Activity, wrestling: Secondary | ICD-10-CM | POA: Insufficient documentation

## 2015-12-04 MED ORDER — CYCLOBENZAPRINE HCL 10 MG PO TABS: 10.0000 mg | ORAL_TABLET | Freq: Two times a day (BID) | ORAL | 0 refills | Status: DC | PRN

## 2015-12-04 MED ORDER — IBUPROFEN 600 MG PO TABS: 600.0000 mg | ORAL_TABLET | Freq: Four times a day (QID) | ORAL | 0 refills | Status: DC | PRN
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
## 2015-12-04 NOTE — ED Triage Notes (Signed)
Pt reports "wrestling" yesterday. Pt states that he thinks that he pulled a muscle. Pt states he has pain in his rt shoulder and neck.

## 2016-08-04 ENCOUNTER — Emergency Department (HOSPITAL_COMMUNITY)
Admission: EM | Admit: 2016-08-04 | Discharge: 2016-08-04 | Disposition: A | Discharge status: Home / Self Care | Payer: BLUE CROSS/BLUE SHIELD | Attending: Emergency Medicine | Admitting: Emergency Medicine

## 2016-08-04 ENCOUNTER — Encounter (HOSPITAL_COMMUNITY): Payer: Self-pay | Admitting: Emergency Medicine

## 2016-08-04 DIAGNOSIS — S39012A Strain of muscle, fascia and tendon of lower back, initial encounter: Secondary | ICD-10-CM

## 2016-08-04 DIAGNOSIS — X500XXA Overexertion from strenuous movement or load, initial encounter: Secondary | ICD-10-CM | POA: Insufficient documentation

## 2016-08-04 DIAGNOSIS — F17210 Nicotine dependence, cigarettes, uncomplicated: Secondary | ICD-10-CM | POA: Insufficient documentation

## 2016-08-04 DIAGNOSIS — Y990 Civilian activity done for income or pay: Secondary | ICD-10-CM | POA: Insufficient documentation

## 2016-08-04 DIAGNOSIS — Y939 Activity, unspecified: Secondary | ICD-10-CM | POA: Insufficient documentation

## 2016-08-04 DIAGNOSIS — Y929 Unspecified place or not applicable: Secondary | ICD-10-CM | POA: Insufficient documentation

## 2016-08-04 MED ORDER — CYCLOBENZAPRINE HCL 10 MG PO TABS: 10.0000 mg | ORAL_TABLET | Freq: Three times a day (TID) | ORAL | 0 refills | Status: DC | PRN

## 2016-08-04 MED ORDER — TRAMADOL HCL 50 MG PO TABS: 50.0000 mg | ORAL_TABLET | Freq: Four times a day (QID) | ORAL | 0 refills | Status: DC | PRN

## 2016-08-04 MED ORDER — DICLOFENAC SODIUM 75 MG PO TBEC: 75.0000 mg | DELAYED_RELEASE_TABLET | Freq: Two times a day (BID) | ORAL | 0 refills | Status: DC
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
## 2016-08-04 NOTE — ED Triage Notes (Signed)
Pt at work and pulling, c/o pain to right lower back radiating down leg. Nad.

## 2016-08-04 NOTE — Discharge Instructions (Signed)
Apply ice packs on and off the back. Avoid twisting and bending movements for one week. Follow-up with primary doctor for recheck if not improving.

## 2021-12-18 DIAGNOSIS — R69 Illness, unspecified: Secondary | ICD-10-CM | POA: Diagnosis not present

## 2022-11-29 ENCOUNTER — Other Ambulatory Visit: Payer: Self-pay

## 2022-11-29 ENCOUNTER — Encounter (HOSPITAL_COMMUNITY): Payer: Self-pay

## 2022-11-29 ENCOUNTER — Emergency Department (HOSPITAL_COMMUNITY): Payer: Self-pay

## 2022-11-29 ENCOUNTER — Emergency Department (HOSPITAL_COMMUNITY)
Admission: EM | Admit: 2022-11-29 | Discharge: 2022-11-29 | Disposition: A | Discharge status: Home / Self Care | Payer: Self-pay

## 2022-11-29 DIAGNOSIS — T5991XA Toxic effect of unspecified gases, fumes and vapors, accidental (unintentional), initial encounter: Secondary | ICD-10-CM

## 2022-11-29 DIAGNOSIS — T59891A Toxic effect of other specified gases, fumes and vapors, accidental (unintentional), initial encounter: Secondary | ICD-10-CM | POA: Insufficient documentation

## 2022-11-29 DIAGNOSIS — R062 Wheezing: Secondary | ICD-10-CM

## 2022-11-29 DIAGNOSIS — D72829 Elevated white blood cell count, unspecified: Secondary | ICD-10-CM | POA: Insufficient documentation

## 2022-11-29 LAB — CBC
HCT: 42.3 % (ref 39.0–52.0)
Hemoglobin: 14.8 g/dL (ref 13.0–17.0)
MCH: 32.8 pg (ref 26.0–34.0)
MCHC: 35 g/dL (ref 30.0–36.0)
MCV: 93.8 fL (ref 80.0–100.0)
Platelets: 240 10*3/uL (ref 150–400)
RBC: 4.51 MIL/uL (ref 4.22–5.81)
RDW: 12.1 % (ref 11.5–15.5)
WBC: 12.2 10*3/uL — ABNORMAL HIGH (ref 4.0–10.5)
nRBC: 0 % (ref 0.0–0.2)

## 2022-11-29 LAB — BASIC METABOLIC PANEL
Anion gap: 11 (ref 5–15)
BUN: 14 mg/dL (ref 6–20)
CO2: 22 mmol/L (ref 22–32)
Calcium: 9 mg/dL (ref 8.9–10.3)
Chloride: 105 mmol/L (ref 98–111)
Creatinine, Ser: 0.89 mg/dL (ref 0.61–1.24)
GFR, Estimated: >60 mL/min (ref >60)
Glucose, Bld: 90 mg/dL (ref 70–99)
Potassium: 3.8 mmol/L (ref 3.5–5.1)
Sodium: 138 mmol/L (ref 135–145)

## 2022-11-29 LAB — TROPONIN I (HIGH SENSITIVITY): Troponin I (High Sensitivity): 3 ng/L (ref <18)

## 2022-11-29 MED ORDER — ACETAMINOPHEN 325 MG PO TABS
650.0000 mg | ORAL_TABLET | Freq: Once | ORAL | Status: AC
  Administered 2022-11-29: 650 mg via ORAL
  Filled 2022-11-29: qty 2

## 2022-11-29 MED ORDER — PREDNISONE 50 MG PO TABS
60.0000 mg | ORAL_TABLET | Freq: Once | ORAL | Status: AC
  Administered 2022-11-29: 60 mg via ORAL
  Filled 2022-11-29: qty 1

## 2022-11-29 MED ORDER — ALBUTEROL SULFATE HFA 108 (90 BASE) MCG/ACT IN AERS: 1.0000 | INHALATION_SPRAY | Freq: Four times a day (QID) | RESPIRATORY_TRACT | 0 refills | Status: DC | PRN

## 2022-11-29 MED ORDER — IPRATROPIUM-ALBUTEROL 0.5-2.5 (3) MG/3ML IN SOLN
3.0000 mL | Freq: Once | RESPIRATORY_TRACT | Status: AC
  Administered 2022-11-29: 3 mL via RESPIRATORY_TRACT
  Filled 2022-11-29: qty 3

## 2022-11-29 MED ORDER — CYCLOBENZAPRINE HCL 5 MG PO TABS: 10.0000 mg | ORAL_TABLET | Freq: Two times a day (BID) | ORAL | 0 refills | Status: DC | PRN

## 2022-11-29 NOTE — ED Triage Notes (Signed)
Pt reports he mixed clorox, fabuloso and ajax together to clean his bathroom and is now coughing and feeling short of breath.

## 2022-11-29 NOTE — Discharge Instructions (Signed)
It was a pleasure taking part in your care today.  As we discussed, your cough is reassuring.  Please use the albuterol inhaler I prescribed you as needed for shortness of breath.  Please return to the ED with any new symptom such as lightheadedness, dizziness or continued shortness of breath.  Please avoid mixing cleaning products in the future.

## 2022-11-29 NOTE — ED Provider Notes (Signed)
[facility] Provider Note   CSN: [id number] Arrival date & time: 11/29/22  1646     History  Chief Complaint  Patient presents with   Toxic Inhalation    Dustin Castillo is a 37 y.o. male with medical history of allergy, back pain, 1 pack a day smoker.  Patient presents to ED for evaluation of shortness of breath, cough.  Patient reports that his symptoms began after mixing Clorox, Ajax, Fabuloso cleaning supplies together to clean his bathroom.  He reports this occurred around 4 PM.  States that he immediately began having shortness of breath as well as a cough.  He also reports that he began having substernal chest pressure around this time.  He denies lightheadedness, dizziness, weakness, lower extremity swelling, nausea or vomiting.  Denies a history of COPD, asthma however he is actively wheezing on examination.  HPI     Home Medications Prior to Admission medications   Medication Sig Start Date End Date Taking? Authorizing Provider  albuterol (VENTOLIN HFA) 108 (90 Base) MCG/ACT inhaler Inhale 1-2 puffs into the lungs every 6 (six) hours as needed for wheezing or shortness of breath. 11/29/22   Al Decant, PA-C  benzonatate (TESSALON) 100 MG capsule Take 1-2 capsules (100-200 mg total) by mouth 3 (three) times daily as needed for cough. Patient not taking: Reported on 08/04/2016 07/04/14   Raelyn Ensign, PA  diclofenac (VOLTAREN) 75 MG EC tablet Take 1 tablet (75 mg total) by mouth 2 (two) times daily. Take with food 08/04/16   Triplett, Tammy, PA-C  HYDROcodone-acetaminophen (NORCO/VICODIN) 5-325 MG per tablet Take 1-2 tablets by mouth every 6 (six) hours as needed. Patient not taking: Reported on 08/04/2016 08/28/14   Roxy Horseman, PA-C  HYDROcodone-homatropine [facility]) 5-1.5 MG/5ML syrup Take 5 mLs by mouth every 8 (eight) hours as needed for cough. Patient not taking: Reported on 08/04/2016 07/04/14   Raelyn Ensign, PA  ibuprofen  (ADVIL,MOTRIN) 200 MG tablet Take 400 mg by mouth every morning.    [provider]  predniSONE (DELTASONE) 20 MG tablet Take 2 tablets (40 mg total) by mouth daily. Patient not taking: Reported on 08/04/2016 08/28/14   Roxy Horseman, PA-C  traMADol (ULTRAM) 50 MG tablet Take 1 tablet (50 mg total) by mouth every 6 (six) hours as needed. 08/04/16   Triplett, Tammy, PA-C      Allergies    Penicillins    Review of Systems   Review of Systems  Respiratory:  Positive for cough and shortness of breath.   Cardiovascular:  Positive for chest pain.  Neurological:  Negative for dizziness, weakness and light-headedness.    Physical Exam Updated Vital Signs BP (!) 104/55   Pulse 64   Temp (!) 97.4 F (36.3 C)   Resp (!) 22   Ht 5\' 9"  (1.753 m)   Wt 90.7 kg   SpO2 92%   BMI 29.53 kg/m  Physical Exam Vitals and nursing note reviewed.  Constitutional:      General: He is not in acute distress.    Appearance: Normal appearance. He is not ill-appearing, toxic-appearing or diaphoretic.  HENT:     Head: Normocephalic and atraumatic.     Nose: Nose normal.     Mouth/Throat:     Mouth: Mucous membranes are moist.     Pharynx: Oropharynx is clear.  Eyes:     Extraocular Movements: Extraocular movements intact.     Conjunctiva/sclera: Conjunctivae normal.  Pupils: Pupils are equal, round, and reactive to light.  Cardiovascular:     Rate and Rhythm: Normal rate and regular rhythm.  Pulmonary:     Breath sounds: Wheezing present.  Abdominal:     General: Abdomen is flat. Bowel sounds are normal.     Palpations: Abdomen is soft.     Tenderness: There is no abdominal tenderness.  Musculoskeletal:     Cervical back: Normal range of motion and neck supple.  Skin:    Capillary Refill: Capillary refill takes less than 2 seconds.  Neurological:     Mental Status: He is alert and oriented to person, place, and time.     ED Results / Procedures / Treatments   Labs (all labs  ordered are listed, but only abnormal results are displayed) Labs Reviewed  CBC - Abnormal; Notable for the following components:      Result Value   WBC 12.2 (*)    All other components within normal limits  BASIC METABOLIC PANEL  TROPONIN I (HIGH SENSITIVITY)    EKG None  Radiology DG Chest 2 View  Result Date: 11/29/2022 CLINICAL DATA:  Shortness of breath, wheezing EXAM: CHEST - 2 VIEW COMPARISON:  11/18/2009 FINDINGS: The heart size and mediastinal contours are within normal limits. No focal airspace consolidation, pleural effusion, or pneumothorax. The visualized skeletal structures are unremarkable. IMPRESSION: No active cardiopulmonary disease. Electronically Signed   By: Duanne Guess D.O.   On: 11/29/2022 18:16    Procedures Procedures   Medications Ordered in ED Medications  ipratropium-albuterol (DUONEB) 0.5-2.5 (3) MG/3ML nebulizer solution 3 mL (3 mLs Nebulization Given 11/29/22 1753)  predniSONE (DELTASONE) tablet 60 mg (60 mg Oral Given 11/29/22 1753)  acetaminophen (TYLENOL) tablet 650 mg (650 mg Oral Given 11/29/22 1753)    ED Course/ Medical Decision Making/ A&P  Medical Decision Making Amount and/or Complexity of Data Reviewed Labs: ordered. Radiology: ordered.  Risk OTC drugs. Prescription drug management.   37 year old male presents to ED for evaluation.  Please see HPI for further details.  On examination patient is afebrile, nontachycardic.  Lung sounds have wheezing throughout, oxygen saturation 96% room air.  Abdomen soft and compressible throughout.  Posterior oropharynx has no erythema, no exudate, uvula midline.  Neurological examination at baseline.  Patient initially provided with DuoNeb, 60 mg of prednisone.  Will collect basic labs to include CBC, BMP, troponin, EKG.  Contacted poison control who states that no further treatment beyond what I have already ordered is recommended at this time.  Patient CBC shows leukocytosis to 12.0  however could be secondary to stress response.  His metabolic panel shows no electrolyte derangement or elevated creatinine.  His troponin is 3 and he has an EKG that is reassuring with a normal sinus rhythm.  His chest x-ray shows no acute process.  After DuoNeb and steroids were administered patient was reevaluated and his lung sounds are clear bilaterally.  He denies lightheadedness, dizziness, weakness or shortness of breath at this time and he is requesting discharge.  I I feel that this is appropriate.  Will discharge him with albuterol inhaler and have him reevaluated by his PCP this week.  Return precautions were provided to the patient and he voiced understanding.  Stable to discharge.   Final Clinical Impression(s) / ED Diagnoses Final diagnoses:  Wheezing  Inhalation of noxious fumes, accidental or unintentional, initial encounter    Rx / DC Orders ED Discharge Orders          Ordered  cyclobenzaprine (FLEXERIL) 5 MG tablet  2 times daily PRN,   Status:  Discontinued        11/29/22 1854    albuterol (VENTOLIN HFA) 108 (90 Base) MCG/ACT inhaler  Every 6 hours PRN,   Status:  Discontinued        11/29/22 1943    albuterol (VENTOLIN HFA) 108 (90 Base) MCG/ACT inhaler  Every 6 hours PRN        11/29/22 1945              Clent Ridges 11/29/22 Darcus Pester, MD 11/29/22 2356

## 2023-12-07 ENCOUNTER — Ambulatory Visit
Admission: EM | Admit: 2023-12-07 | Discharge: 2023-12-07 | Disposition: A | Discharge status: Home / Self Care | Payer: Self-pay

## 2023-12-07 DIAGNOSIS — J4521 Mild intermittent asthma with (acute) exacerbation: Secondary | ICD-10-CM

## 2023-12-07 DIAGNOSIS — J069 Acute upper respiratory infection, unspecified: Secondary | ICD-10-CM

## 2023-12-07 MED ORDER — ALBUTEROL SULFATE (2.5 MG/3ML) 0.083% IN NEBU
2.5000 mg | INHALATION_SOLUTION | Freq: Once | RESPIRATORY_TRACT | Status: AC
  Administered 2023-12-07: 2.5 mg via RESPIRATORY_TRACT

## 2023-12-07 MED ORDER — PROMETHAZINE-DM 6.25-15 MG/5ML PO SYRP: 5.0000 mL | ORAL_SOLUTION | Freq: Four times a day (QID) | ORAL | 0 refills | Status: AC | PRN

## 2023-12-07 MED ORDER — ALBUTEROL SULFATE HFA 108 (90 BASE) MCG/ACT IN AERS: 2.0000 | INHALATION_SPRAY | RESPIRATORY_TRACT | 0 refills | Status: AC | PRN

## 2023-12-07 MED ORDER — DEXAMETHASONE SOD PHOSPHATE PF 10 MG/ML IJ SOLN
10.0000 mg | Freq: Once | INTRAMUSCULAR | Status: AC
  Administered 2023-12-07: 10 mg via INTRAMUSCULAR

## 2023-12-07 NOTE — Discharge Instructions (Signed)
 We have given you a steroid shot today to help with the inflammation and cough as well as an albuterol  nebulizer treatment.  I have prescribed a cough syrup and an albuterol  inhaler to be used every 4 hours as needed.  You may take over-the-counter antihistamines, nasal sprays, cold congestion medications as needed.  Follow-up for worsening or unresolving symptoms.
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
## 2023-12-07 NOTE — ED Triage Notes (Signed)
 Pt reports cough, congestion SOB, headache  x 3 days feels like he needs cough up something but is unable, feels like he is gasping for air.

## 2024-01-27 ENCOUNTER — Ambulatory Visit
Admission: EM | Admit: 2024-01-27 | Discharge: 2024-01-27 | Disposition: A | Discharge status: Home / Self Care | Payer: Self-pay | Attending: Family Medicine | Admitting: Family Medicine

## 2024-01-27 DIAGNOSIS — J4521 Mild intermittent asthma with (acute) exacerbation: Secondary | ICD-10-CM

## 2024-01-27 DIAGNOSIS — J101 Influenza due to other identified influenza virus with other respiratory manifestations: Secondary | ICD-10-CM

## 2024-01-27 LAB — POCT INFLUENZA A/B
Influenza A, POC: POSITIVE — AB
Influenza B, POC: NEGATIVE

## 2024-01-27 MED ORDER — OSELTAMIVIR PHOSPHATE 75 MG PO CAPS: 75.0000 mg | ORAL_CAPSULE | Freq: Two times a day (BID) | ORAL | 0 refills | Status: AC

## 2024-01-27 MED ORDER — AZELASTINE HCL 0.1 % NA SOLN: 1.0000 | Freq: Two times a day (BID) | NASAL | 0 refills | Status: AC

## 2024-01-27 MED ORDER — ALBUTEROL SULFATE (2.5 MG/3ML) 0.083% IN NEBU
2.5000 mg | INHALATION_SOLUTION | Freq: Once | RESPIRATORY_TRACT | Status: AC
  Administered 2024-01-27: 2.5 mg via RESPIRATORY_TRACT

## 2024-01-27 MED ORDER — ALBUTEROL SULFATE HFA 108 (90 BASE) MCG/ACT IN AERS: 2.0000 | INHALATION_SPRAY | RESPIRATORY_TRACT | 0 refills | Status: AC | PRN

## 2024-01-27 MED ORDER — PROMETHAZINE-DM 6.25-15 MG/5ML PO SYRP: 5.0000 mL | ORAL_SOLUTION | Freq: Four times a day (QID) | ORAL | 0 refills | Status: AC | PRN

## 2024-01-27 MED ORDER — DEXAMETHASONE SOD PHOSPHATE PF 10 MG/ML IJ SOLN
10.0000 mg | Freq: Once | INTRAMUSCULAR | Status: AC
  Administered 2024-01-27: 10 mg via INTRAMUSCULAR

## 2024-01-27 NOTE — Discharge Instructions (Signed)
 We have given you a steroid shot today and a breathing treatment and I have refilled your albuterol  inhaler, cough syrup, nasal spray.  You may take over-the-counter remedies as needed, drink plenty fluids, get lots of rest.  I have sent over the antiviral Tamiflu in case you would like to take this to see if it helps resolve the flu symptoms sooner.
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
## 2024-01-27 NOTE — ED Triage Notes (Addendum)
 Pt states cough,congestion,sob and body aches  for the past 3 days.  States last night he felt hot like he had a fever.  States he has been using his inhaler and robitussin at home. States he had a negative covid home test.

## 2024-03-01 ENCOUNTER — Ambulatory Visit: Payer: Self-pay

## 2024-03-01 ENCOUNTER — Ambulatory Visit
Admission: EM | Admit: 2024-03-01 | Discharge: 2024-03-01 | Disposition: A | Discharge status: Home / Self Care | Payer: Self-pay | Source: Home / Self Care

## 2024-03-01 ENCOUNTER — Other Ambulatory Visit: Payer: Self-pay

## 2024-03-01 ENCOUNTER — Encounter: Payer: Self-pay | Admitting: Emergency Medicine

## 2024-03-01 DIAGNOSIS — S61211A Laceration without foreign body of left index finger without damage to nail, initial encounter: Secondary | ICD-10-CM

## 2024-03-01 MED ORDER — MUPIROCIN 2 % EX OINT: 1.0000 | TOPICAL_OINTMENT | Freq: Two times a day (BID) | CUTANEOUS | 0 refills | Status: AC

## 2024-03-01 MED ORDER — CHLORHEXIDINE GLUCONATE 4 % EX SOLN: Freq: Every day | CUTANEOUS | 0 refills | Status: AC | PRN

## 2024-03-01 MED ORDER — BACITRACIN 500 UNIT/GM EX OINT: 1.0000 | TOPICAL_OINTMENT | Freq: Two times a day (BID) | CUTANEOUS | Status: DC

## 2024-03-01 NOTE — ED Triage Notes (Signed)
 Pt reports laceration to left index finger after saw went over finger while working on personal truck. No active bleeding noted. Linear line noted to left index finger. Site edges remain approximated with bending of left index finger. Last tetanus shot approx 2-3 years ago.

## 2024-03-01 NOTE — ED Notes (Signed)
 CMA has laceration soaking in hibiclense and sterile water. Pt tolerating well.
